# Patient Record
Sex: Male | Born: 1980 | Race: Black or African American | Hispanic: No | Marital: Single
Health system: Midwestern US, Community
[De-identification: ages and names within clinical notes are randomized; demographics above are authoritative.]

## PROBLEM LIST (undated history)

## (undated) DIAGNOSIS — M109 Gout, unspecified: Secondary | ICD-10-CM

## (undated) DIAGNOSIS — E7800 Pure hypercholesterolemia, unspecified: Secondary | ICD-10-CM

## (undated) DIAGNOSIS — I10 Essential (primary) hypertension: Secondary | ICD-10-CM

## (undated) DIAGNOSIS — R7303 Prediabetes: Secondary | ICD-10-CM

## (undated) DIAGNOSIS — Z794 Long term (current) use of insulin: Secondary | ICD-10-CM

## (undated) DIAGNOSIS — E1142 Type 2 diabetes mellitus with diabetic polyneuropathy: Secondary | ICD-10-CM

## (undated) DIAGNOSIS — R142 Eructation: Principal | ICD-10-CM

## (undated) DIAGNOSIS — M1A9XX0 Chronic gout, unspecified, without tophus (tophi): Secondary | ICD-10-CM

## (undated) DIAGNOSIS — E1122 Type 2 diabetes mellitus with diabetic chronic kidney disease: Secondary | ICD-10-CM

## (undated) DIAGNOSIS — N1831 Chronic kidney disease, stage 3a (HCC): Principal | ICD-10-CM

## (undated) DIAGNOSIS — E119 Type 2 diabetes mellitus without complications: Principal | ICD-10-CM

## (undated) DIAGNOSIS — N1832 Chronic kidney disease, stage 3b (HCC): Principal | ICD-10-CM

## (undated) DIAGNOSIS — N184 Chronic kidney disease, stage 4 (severe): Secondary | ICD-10-CM

## (undated) DIAGNOSIS — Z0184 Encounter for antibody response examination: Secondary | ICD-10-CM

---

## 2002-08-04 ENCOUNTER — Encounter: Payer: Self-pay | Admitting: Emergency Medicine

## 2002-08-04 ENCOUNTER — Emergency Department (HOSPITAL_COMMUNITY): Admission: EM | Admit: 2002-08-04 | Discharge: 2002-08-04 | Payer: Self-pay | Admitting: Emergency Medicine

## 2002-10-01 ENCOUNTER — Encounter: Payer: Self-pay | Admitting: Emergency Medicine

## 2002-10-01 ENCOUNTER — Emergency Department (HOSPITAL_COMMUNITY): Admission: EM | Admit: 2002-10-01 | Discharge: 2002-10-02 | Payer: Self-pay | Admitting: Emergency Medicine

## 2002-10-09 ENCOUNTER — Emergency Department (HOSPITAL_COMMUNITY): Admission: EM | Admit: 2002-10-09 | Discharge: 2002-10-09 | Payer: Self-pay | Admitting: Emergency Medicine

## 2002-10-09 ENCOUNTER — Encounter: Payer: Self-pay | Admitting: Emergency Medicine

## 2004-02-12 ENCOUNTER — Emergency Department (HOSPITAL_COMMUNITY): Admission: EM | Admit: 2004-02-12 | Discharge: 2004-02-12 | Payer: Self-pay | Admitting: Emergency Medicine

## 2008-02-25 ENCOUNTER — Emergency Department (HOSPITAL_COMMUNITY): Admission: EM | Admit: 2008-02-25 | Discharge: 2008-02-26 | Payer: Self-pay | Admitting: Emergency Medicine

## 2008-10-14 ENCOUNTER — Encounter: Payer: Self-pay | Admitting: Emergency Medicine

## 2008-10-14 ENCOUNTER — Inpatient Hospital Stay (HOSPITAL_COMMUNITY): Admission: EM | Admit: 2008-10-14 | Discharge: 2008-10-20 | Payer: Self-pay | Admitting: *Deleted

## 2008-10-14 ENCOUNTER — Ambulatory Visit: Payer: Self-pay | Admitting: Internal Medicine

## 2008-10-18 ENCOUNTER — Encounter (INDEPENDENT_AMBULATORY_CARE_PROVIDER_SITE_OTHER): Payer: Self-pay | Admitting: Internal Medicine

## 2008-11-17 ENCOUNTER — Emergency Department (HOSPITAL_COMMUNITY): Admission: EM | Admit: 2008-11-17 | Discharge: 2008-11-17 | Payer: Self-pay | Admitting: Emergency Medicine

## 2011-04-15 NOTE — H&P (Signed)
Michael Hatfield, Michael Hatfield         ACCOUNT NO.:  [id number]   MEDICAL RECORD NO.:  [id number]          PATIENT TYPE:  INP   LOCATION:  1222                         FACILITY:  [facility]   PHYSICIAN:  Della Goo, M.D. DATE OF BIRTH:  1980-12-27   DATE OF ADMISSION:  10/14/2008  DATE OF DISCHARGE:                              HISTORY & PHYSICAL   PRIMARY CARE PHYSICIAN:  None.   CHIEF COMPLAINT:  Severe headache.   HISTORY OF PRESENT ILLNESS:  This is a 30 year old male who presents to  the emergency department with complaints of a severe headache over a 1-  week period which has been worsening over the past 24 hours.  The  patient reports that the headache began to worsen, 10/10 in severity and  was increased across his forehead and over the right eye area.  The  patient reports having visual changes associated with the headache.  He  denies having any syncope, but does report being lightheaded with the  symptoms.  Denies having any nausea or vomiting.  The patient was  evaluated at the [facility] emergency department initially.  He was found to have a blood pressure of 224/143.  The patient reported  being out of his medications for 1 month and being unable to get his  medication secondary to the cost.  The patient is visiting from out-of-  town, he is from [place].   PAST MEDICAL HISTORY:  Significant for hypertension.   PAST SURGICAL HISTORY:  None.   MEDICATIONS:  1. Norvasc 10 mg p.o. daily.  2. Metoprolol 100 mg p.o. b.i.d.  3. Hydrochlorothiazide 25 mg 1 p.o. q.a.m.   ALLERGIES:  NO KNOWN DRUG ALLERGIES.   SOCIAL HISTORY:  The patient is a smoker.  He reports smoking a fourth  of a pack daily for 13 years.  He denies alcohol usage.  He reports  occasional marijuana usage.   FAMILY HISTORY:  Positive for hypertension.   REVIEW OF SYSTEMS:  Pertinents are mentioned above.   PHYSICAL EXAMINATION:  GENERAL:  This is a 30 year old morbidly obese  male in  discomfort but no acute distress.  VITAL SIGNS:  Temperature 98.1, blood pressure 224/143 initially, heart  rate 97, respirations 22.  O2 sats 98% on room air.  HEENT:  Normocephalic, atraumatic.  Pupils are reactive to light and  symmetric.  Extraocular movements are intact.  Funduscopic benign.  Oropharynx is clear.  NECK:  Supple.  Full range of motion.  No thyromegaly, adenopathy or  jugulovenous distention.  CARDIOVASCULAR:  Regular rate and rhythm.  No  murmurs, gallops or rubs.  LUNGS:  Clear to auscultation bilaterally.  ABDOMEN:  Positive bowel sounds.  Soft, nontender, nondistended.  EXTREMITIES:  Without cyanosis, clubbing or edema.  NEUROLOGIC:  Nonfocal.   LABORATORY STUDIES:  Chemistry reveals a sodium of 138, potassium 3.8,  chloride 100, bicarb 29, BUN 15, creatinine 1.3 and glucose 110.  Cardiac enzymes point of care markers:  Myoglobin level 148, CK-MB 1.2,  troponin less than 0.05.   ASSESSMENT:  A 30 year old male being admitted with:  1. Hypertensive urgency.  2. Morbid obesity.  3.  Tobacco abuse.  4. Cephalgia secondary to #1.   PLAN:  The patient will be admitted to an ICU area.  He arrived on a  labetalol drip.  However, his blood pressure is not improving with  titration of the labetalol.  A Cardene drip has been ordered and will be  started to help titrate his blood pressure downward.  His regular  medications will be ordered and resumed in the a.m.  The patient will be  placed on a nicotine patch to prevent nicotine withdrawal and he will  also be placed on DVT and GI prophylaxis.  Further workup will ensue  pending results of his laboratory studies and his clinical course.      Della Goo, M.D.  Electronically Signed     HJ/MEDQ  D:  10/14/2008  T:  10/14/2008  Job:  [id number]

## 2011-04-15 NOTE — Discharge Summary (Signed)
Michael Hatfield, Michael Hatfield         ACCOUNT NO.:  [id number]   MEDICAL RECORD NO.:  [id number]          PATIENT TYPE:  INP   LOCATION:  1401                         FAC[facility]  [facility]   PHYSICIAN:  Herbie Saxon, MDDATE OF BIRTH:  12/12/1980   DATE OF ADMISSION:  10/14/2008  DATE OF DISCHARGE:  10/20/2008                               DISCHARGE SUMMARY   DISCHARGE DIAGNOSES:  1. Severe hypertension controlled.  2. Morbid obesity.  3. New onset diabetes.  4. Tobacco abuse.  5. Hyperlipidemia.  6. Hypokalemia repleted.  7. Leukocytosis improved.  8. Episodic bradycardia asymptomatic.  9. Poor medical compliance.  10.History of cannabis abuse.   RADIOLOGY:  The renal ultrasound of October 19, 2008, shows normal  kidneys.  CT head at admission showed no acute intracranial abnormality.   PAST MEDICAL HISTORY:  This 30 year old African American male presented  to the emergency room with complaint of severe headache with blurring of  vision.  On presentation his blood pressure was severely elevated at  224/143.  He had not been compliant with his blood pressure medication  for 1 month .  He was admitted to the intensive care unit, placed on a  Cardene drip which was titrated downward and weaned.  Counseled on  tobacco and cannabis abuse cessation.  Started on nicotine patch and  sedatives as needed.  History of hypokalemia which was repleted.  Magnesium level was found to be okay.  His blood pressure regimen  included labetalol, hydralazine, diltiazem and lisinopril to obtain a  good control.  Hemoglobin A1c checked and returned 6.6. He was started  on low dose metformin 500 mg daily with sliding scale insulin coverage .  Hyperglycemia improved.  Lipid panel showed that HDL level was 69, LDL  was 132.  He has been started on Niacin.  Homocysteine level was normal.  Unable to do a renal angiography at present, this could be arranged as  an outpatient to rule out renal artery  stenosis.   DISCHARGE CONDITION:  Is stable.   DIET:  Will be heart healthy, 1800 calorie, ADA low-cholesterol.   ACTIVITY:  Increase daily as tolerated.   FOLLOWUP:  With primary care physician at [facility] in the next 5-  7 days.   DISCHARGE MEDICATIONS:  1. Lisinopril 40 mg daily.  2. Diltiazem CD 180 mg b.i.d.  3. Hydralazine 100 mg t.i.d.  4. HCTZ 25 mg daily.  5. Metoprolol 100 mg b.i.d.  6. Potassium chloride 10 mEq daily.  7. Metformin 500 mg daily.  Accu-Chek q.a.c. and h.s. and sliding      scale insulin coverage .   PHYSICAL EXAMINATION:  GENERAL:  On examination this is a young man who  is not in acute respiratory distress.  VITAL SIGNS:  His temperature is 98, pulse 75, respiratory rate 20,  blood pressure 134/80.  HEENT:  Pupils are equal and reactive to light and accommodation.  Head  is atraumatic, normocephalic.  Mucous membranes are moist.  Oropharynx  and nasopharynx are clear.  NECK:  Supple.  No elevated JVD or carotid bruits.  No thyromegaly.  CHEST:  Clinically clear.  HEART:  Sounds 1 and 2 regular rate and rhythm.  ABDOMEN:  Benign. inguinal orifices are patent.  Bowel sounds present.  NEUROLOGICAL:  No neurologic deficit.  He is alert and oriented to time,  place and person.  Deep tendon reflexes are 2 globally.  Peripheral  pulses present .  There is no pedal edema.   Available labs show a chemistry on October 20, 2008, sodium is 140,  potassium 3.9, chloride 102, bicarbonate 29, glucose 106, BUN 10,  creatinine 1.5.  WBC is 13, hematocrit 44, platelet count is 203.  He is  to have his CBC repeated as an outpatient with primary care physician.  AST 20, ALT 24 and direct bilirubin 1.1.  Albumin is 3.3.      Herbie Saxon, MD  Electronically Signed     MIO/MEDQ  D:  10/20/2008  T:  10/20/2008  Job:  [id number]

## 2011-08-25 LAB — BASIC METABOLIC PANEL
CO2: 28
Chloride: 101
GFR calc Af Amer: >60
Potassium: 3.2 — ABNORMAL LOW
Sodium: 137

## 2011-08-25 LAB — DIFFERENTIAL
Basophils Relative: 0
Eosinophils Absolute: 0.3
Lymphs Abs: 1.8
Monocytes Absolute: 0.5
Monocytes Relative: 4
Neutrophils Relative %: 79 — ABNORMAL HIGH

## 2011-08-25 LAB — CBC
Hemoglobin: 14.6
MCHC: 34.2
MCV: 82.4
RBC: 5.18
WBC: 12.8 — ABNORMAL HIGH

## 2011-09-02 LAB — COMPREHENSIVE METABOLIC PANEL
Albumin: 3.3 — ABNORMAL LOW
Alkaline Phosphatase: 69
BUN: 10
CO2: 29
Chloride: 103
Creatinine, Ser: 1.5
GFR calc non Af Amer: 56 — ABNORMAL LOW
Glucose, Bld: 106 — ABNORMAL HIGH
Potassium: 3.9
Total Bilirubin: 1.1

## 2011-09-02 LAB — TSH: TSH: 3.746

## 2011-09-02 LAB — CK TOTAL AND CKMB (NOT AT ARMC)
CK, MB: 1
Relative Index: 0.7

## 2011-09-02 LAB — CBC
HCT: 44.2
HCT: 44.8
Hemoglobin: 14.8
Hemoglobin: 14.8
MCHC: 33.4
MCV: 82.8
MCV: 82.8
RBC: 5.34
RBC: 5.47
RBC: 5.52
RDW: 13.3
WBC: 12.5 — ABNORMAL HIGH
WBC: 13.4 — ABNORMAL HIGH

## 2011-09-02 LAB — BASIC METABOLIC PANEL
BUN: 13
BUN: 9
CO2: 25
CO2: 29
Calcium: 9.5
Chloride: 100
Chloride: 101
Chloride: 101
Chloride: 103
Chloride: 99
Creatinine, Ser: 1.08
Creatinine, Ser: 1.22
GFR calc Af Amer: >60
GFR calc Af Amer: >60
GFR calc Af Amer: >60
GFR calc non Af Amer: 57 — ABNORMAL LOW
GFR calc non Af Amer: >60
Glucose, Bld: 128 — ABNORMAL HIGH
Potassium: 3.2 — ABNORMAL LOW
Potassium: 3.5
Potassium: 3.6
Sodium: 134 — ABNORMAL LOW
Sodium: 136
Sodium: 139

## 2011-09-02 LAB — MAGNESIUM: Magnesium: 1.9

## 2011-09-02 LAB — GLUCOSE, CAPILLARY
Glucose-Capillary: 123 — ABNORMAL HIGH
Glucose-Capillary: 142 — ABNORMAL HIGH
Glucose-Capillary: 160 — ABNORMAL HIGH

## 2011-09-02 LAB — HEPATIC FUNCTION PANEL
ALT: 24
AST: 20
Albumin: 3.3 — ABNORMAL LOW
Alkaline Phosphatase: 67
Bilirubin, Direct: 0.1
Total Bilirubin: 1.2

## 2011-09-02 LAB — CARDIAC PANEL(CRET KIN+CKTOT+MB+TROPI)
Relative Index: 0.9
Troponin I: 0.02
Troponin I: 0.02

## 2011-09-02 LAB — LIPID PANEL
Cholesterol: 195
LDL Cholesterol: 132 — ABNORMAL HIGH
Total CHOL/HDL Ratio: 12.2
VLDL: 47 — ABNORMAL HIGH

## 2011-09-02 LAB — METANEPHRINES, PLASMA
Metanephrine, Free: <25 pg/mL (ref <=57)
Normetanephrine, Free: 180 pg/mL — ABNORMAL HIGH (ref <=148)
Total Metanephrines-Plasma: 180 pg/mL (ref <=205)

## 2011-09-02 LAB — HEMOGLOBIN A1C: Mean Plasma Glucose: 143

## 2011-09-03 LAB — BASIC METABOLIC PANEL
BUN: 15
Calcium: 9.6
Chloride: 100
Creatinine, Ser: 1.3
GFR calc Af Amer: >60

## 2011-09-03 LAB — POCT CARDIAC MARKERS
CKMB, poc: 1.2
Troponin i, poc: <0.05

## 2016-08-08 ENCOUNTER — Emergency Department (HOSPITAL_BASED_OUTPATIENT_CLINIC_OR_DEPARTMENT_OTHER)
Admission: EM | Admit: 2016-08-08 | Discharge: 2016-08-08 | Disposition: A | Discharge status: Home / Self Care | Payer: Managed Care, Other (non HMO) | Attending: Emergency Medicine | Admitting: Emergency Medicine

## 2016-08-08 ENCOUNTER — Encounter (HOSPITAL_BASED_OUTPATIENT_CLINIC_OR_DEPARTMENT_OTHER): Payer: Self-pay | Admitting: Emergency Medicine

## 2016-08-08 DIAGNOSIS — Z79899 Other long term (current) drug therapy: Secondary | ICD-10-CM | POA: Insufficient documentation

## 2016-08-08 DIAGNOSIS — M79672 Pain in left foot: Secondary | ICD-10-CM | POA: Diagnosis not present

## 2016-08-08 DIAGNOSIS — I10 Essential (primary) hypertension: Secondary | ICD-10-CM | POA: Diagnosis not present

## 2016-08-08 DIAGNOSIS — R51 Headache: Secondary | ICD-10-CM | POA: Insufficient documentation

## 2016-08-08 HISTORY — DX: Gout, unspecified: M10.9

## 2016-08-08 HISTORY — DX: Essential (primary) hypertension: I10

## 2016-08-08 MED ORDER — AMLODIPINE BESYLATE 10 MG PO TABS: 10.0000 mg | ORAL_TABLET | Freq: Every day | ORAL | 0 refills | Status: DC

## 2016-08-08 MED ORDER — LABETALOL HCL 100 MG PO TABS: 200.0000 mg | ORAL_TABLET | Freq: Two times a day (BID) | ORAL | 0 refills | Status: DC

## 2016-08-08 MED ORDER — HYDROCHLOROTHIAZIDE 25 MG PO TABS: 25.0000 mg | ORAL_TABLET | Freq: Every day | ORAL | 0 refills | Status: DC

## 2016-08-08 MED ORDER — INDOMETHACIN 50 MG PO CAPS: 50.0000 mg | ORAL_CAPSULE | Freq: Two times a day (BID) | ORAL | 0 refills | Status: AC

## 2016-08-08 MED FILL — LABETALOL HCL 100 MG TABLET: 100 | 7 days supply | Qty: 28 | Fill #0

## 2016-08-08 MED FILL — HYDROCHLOROTHIAZIDE 25 MG T: 25 | 7 days supply | Qty: 7 | Fill #0

## 2016-08-08 MED FILL — AMLODIPINE BESYLATE 10 MG T: 10 | 7 days supply | Qty: 7 | Fill #0

## 2016-08-08 MED FILL — INDOMETHACIN 50 MG CAPSULE: 50 | 7 days supply | Qty: 14 | Fill #0

## 2016-08-08 NOTE — ED Provider Notes (Signed)
[facility] Provider Note   CSN: [id number] Arrival date & time: 08/08/16  1609  By signing my name below, I, Phillis Haggis, attest that this documentation has been prepared under the direction and in the presence of Felicie Morn, NP-C. Electronically Signed: Phillis Haggis, ED Scribe. 08/08/16. 4:57 PM.   History   Chief Complaint Chief Complaint  Patient presents with  . Foot Pain    The history is provided by the patient. No language interpreter was used.  Foot Pain  This is a new problem. The current episode started more than 2 days ago. The problem occurs constantly. The problem has been gradually worsening. Associated symptoms include headaches. He has tried nothing for the symptoms.  HPI Comments: Michael Hatfield is a 35 y.o. Male with a hx of gout, HTN, and borderline Diabetes who presents to the Emergency Department complaining of right foot swelling and pain onset 3 days ago. His worst pain is over the dorsum of the foot. Pt reports that this pain feels similar to his past hx of gout. Pt is also complaining of increased BP. He states that he is from [place] and typically takes 3 medications including HCTZ, labetalol and amlodipine. He has since been out during his visit to [facility]. Pt reports headache secondary to increased BP. Pt has previously taken ibuprofen and drank cherry juice for gout flare ups. He denies visual changes, numbness or weakness.   Past Medical History:  Diagnosis Date  . Gout   . Hypertension     There are no active problems to display for this patient.   History reviewed. No pertinent surgical history.     Home Medications    Prior to Admission medications   Medication Sig Start Date End Date Taking? Authorizing Provider  amLODipine-benazepril (LOTREL) 5-10 MG capsule Take 1 capsule by mouth daily.   Yes Historical Provider, MD  hydrochlorothiazide (HYDRODIURIL) 25 MG tablet Take 25 mg by mouth daily.   Yes Historical Provider, MD    labetalol (NORMODYNE) 200 MG tablet Take 200 mg by mouth 2 (two) times daily.   Yes Historical Provider, MD    Family History History reviewed. No pertinent family history.  Social History Social History  Substance Use Topics  . Smoking status: Never Smoker  . Smokeless tobacco: Never Used  . Alcohol use No     Allergies   Review of patient's allergies indicates no known allergies.   Review of Systems Review of Systems  Eyes: Negative for visual disturbance.  Musculoskeletal: Positive for arthralgias and joint swelling.  Neurological: Positive for headaches. Negative for weakness and numbness.  All other systems reviewed and are negative.  Physical Exam Updated Vital Signs BP (!) 214/135 Comment: hasn't taken BP meds in several days, visiting from [place]  Pulse 97   Temp 98.2 F (36.8 C) (Oral)   Resp 18   Ht 6\' 2"  (1.88 m)   Wt (!) 370 lb (167.8 kg)   SpO2 98%   BMI 47.51 kg/m   Physical Exam  Constitutional: He is oriented to person, place, and time. He appears well-developed and well-nourished.  HENT:  Head: Normocephalic and atraumatic.  Eyes: Conjunctivae and EOM are normal. Pupils are equal, round, and reactive to light.  Neck: Normal range of motion. Neck supple.  Cardiovascular: Normal rate and regular rhythm.   Pulmonary/Chest: Effort normal.  Musculoskeletal: Normal range of motion.  Moderately warm dorsum to the right foot with mild pain to the outer aspect of the metatarsals;  good distal pulses and sensation  Neurological: He is alert and oriented to person, place, and time.  Skin: Skin is warm and dry.  Psychiatric: He has a normal mood and affect. His behavior is normal.  Nursing note and vitals reviewed.    ED Treatments / Results  DIAGNOSTIC STUDIES: Oxygen Saturation is 98% on RA, normal by my interpretation.    COORDINATION OF CARE: 4:53 PM-Discussed treatment plan which includes anti-inflammatories and crutches with pt at bedside and pt  agreed to plan.    Labs (all labs ordered are listed, but only abnormal results are displayed) Labs Reviewed - No data to display  EKG  EKG Interpretation None       Radiology No results found.  Procedures Procedures (including critical care time)  Medications Ordered in ED Medications - No data to display   Initial Impression / Assessment and Plan / ED Course  I have reviewed the triage vital signs and the nursing notes.  Pertinent labs & imaging results that were available during my care of the patient were reviewed by me and considered in my medical decision making (see chart for details).  Clinical Course  Patient with history of gout. Left foot pain with mild swelling and warmth. Does not appear to be cellulitis. Conservative therapy recommended and discussed. Patient will be discharged home with indocin & is agreeable with above plan. Patient also provided with Rx for limited anti-hypertensive medications. He is scheduled to return to his home in [place] in the next two days. He will follow-up with his PCP upon his return.  Return precautions discussed. Pt appears safe for discharge.    Final Clinical Impressions(s) / ED Diagnoses   Final diagnoses:  None  I personally performed the services described in this documentation, which was scribed in my presence. The recorded information has been reviewed and is accurate.   New Prescriptions New Prescriptions   No medications on file     Felicie Mornavid Ezell Poke, NP 08/09/16 0121    Rolan BuccoMelanie Belfi, MD 08/10/16 [phone]

## 2016-08-08 NOTE — ED Triage Notes (Signed)
Pt states right foot pain, thinks it's his gout

## 2019-05-08 ENCOUNTER — Encounter (HOSPITAL_BASED_OUTPATIENT_CLINIC_OR_DEPARTMENT_OTHER): Payer: Self-pay | Admitting: Emergency Medicine

## 2019-05-08 ENCOUNTER — Emergency Department (HOSPITAL_BASED_OUTPATIENT_CLINIC_OR_DEPARTMENT_OTHER): Payer: Self-pay

## 2019-05-08 ENCOUNTER — Other Ambulatory Visit: Payer: Self-pay

## 2019-05-08 ENCOUNTER — Emergency Department (HOSPITAL_BASED_OUTPATIENT_CLINIC_OR_DEPARTMENT_OTHER)
Admission: EM | Admit: 2019-05-08 | Discharge: 2019-05-08 | Disposition: A | Discharge status: Home / Self Care | Payer: Self-pay | Attending: Emergency Medicine | Admitting: Emergency Medicine

## 2019-05-08 DIAGNOSIS — I129 Hypertensive chronic kidney disease with stage 1 through stage 4 chronic kidney disease, or unspecified chronic kidney disease: Secondary | ICD-10-CM | POA: Insufficient documentation

## 2019-05-08 DIAGNOSIS — N183 Chronic kidney disease, stage 3 unspecified: Secondary | ICD-10-CM

## 2019-05-08 DIAGNOSIS — M109 Gout, unspecified: Secondary | ICD-10-CM | POA: Insufficient documentation

## 2019-05-08 DIAGNOSIS — M25572 Pain in left ankle and joints of left foot: Secondary | ICD-10-CM

## 2019-05-08 DIAGNOSIS — I10 Essential (primary) hypertension: Secondary | ICD-10-CM

## 2019-05-08 DIAGNOSIS — Z79899 Other long term (current) drug therapy: Secondary | ICD-10-CM | POA: Insufficient documentation

## 2019-05-08 LAB — BASIC METABOLIC PANEL
Anion gap: 8 (ref 5–15)
BUN: 22 mg/dL — ABNORMAL HIGH (ref 6–20)
CO2: 27 mmol/L (ref 22–32)
Calcium: 9.2 mg/dL (ref 8.9–10.3)
Chloride: 100 mmol/L (ref 98–111)
Creatinine, Ser: 1.94 mg/dL — ABNORMAL HIGH (ref 0.61–1.24)
GFR calc Af Amer: 49 mL/min — ABNORMAL LOW (ref >60)
GFR calc non Af Amer: 43 mL/min — ABNORMAL LOW (ref >60)
Glucose, Bld: 184 mg/dL — ABNORMAL HIGH (ref 70–99)
Potassium: 3.7 mmol/L (ref 3.5–5.1)
Sodium: 135 mmol/L (ref 135–145)

## 2019-05-08 LAB — CBC
HCT: 44.9 % (ref 39.0–52.0)
Hemoglobin: 14.2 g/dL (ref 13.0–17.0)
MCH: 27.2 pg (ref 26.0–34.0)
MCHC: 31.6 g/dL (ref 30.0–36.0)
MCV: 86 fL (ref 80.0–100.0)
Platelets: 183 10*3/uL (ref 150–400)
RBC: 5.22 MIL/uL (ref 4.22–5.81)
RDW: 13.5 % (ref 11.5–15.5)
WBC: 10.4 10*3/uL (ref 4.0–10.5)
nRBC: 0 % (ref 0.0–0.2)

## 2019-05-08 MED ORDER — TRAMADOL HCL 50 MG PO TABS
50.0000 mg | ORAL_TABLET | Freq: Once | ORAL | Status: AC
  Administered 2019-05-08: 50 mg via ORAL
  Filled 2019-05-08: qty 1

## 2019-05-08 MED ORDER — HYDROCHLOROTHIAZIDE 25 MG PO TABS: 25.0000 mg | ORAL_TABLET | Freq: Every day | ORAL | 1 refills | Status: DC

## 2019-05-08 MED ORDER — ACETAMINOPHEN 500 MG PO TABS
1000.0000 mg | ORAL_TABLET | Freq: Once | ORAL | Status: AC
  Administered 2019-05-08: 1000 mg via ORAL
  Filled 2019-05-08: qty 2

## 2019-05-08 MED ORDER — HYDROCHLOROTHIAZIDE 25 MG PO TABS: 25.0000 mg | ORAL_TABLET | Freq: Every day | ORAL | 1 refills | Status: AC

## 2019-05-08 MED ORDER — METFORMIN HCL 500 MG PO TABS: 500.0000 mg | ORAL_TABLET | Freq: Two times a day (BID) | ORAL | 1 refills | Status: AC

## 2019-05-08 MED ORDER — TRAMADOL HCL 50 MG PO TABS: 50.0000 mg | ORAL_TABLET | Freq: Four times a day (QID) | ORAL | 0 refills | Status: AC | PRN

## 2019-05-08 MED ORDER — PREDNISONE 10 MG PO TABS: 40.0000 mg | ORAL_TABLET | Freq: Every day | ORAL | 0 refills | Status: AC

## 2019-05-08 MED ORDER — LISINOPRIL 20 MG PO TABS: 20.0000 mg | ORAL_TABLET | Freq: Every day | ORAL | 1 refills | Status: AC

## 2019-05-08 MED ORDER — SIMVASTATIN 10 MG PO TABS: 10.0000 mg | ORAL_TABLET | Freq: Every day | ORAL | 1 refills | Status: AC

## 2019-05-08 MED ORDER — AMLODIPINE BESYLATE 5 MG PO TABS
10.0000 mg | ORAL_TABLET | Freq: Once | ORAL | Status: AC
  Administered 2019-05-08: 10 mg via ORAL
  Filled 2019-05-08: qty 2

## 2019-05-08 MED ORDER — CARVEDILOL 12.5 MG PO TABS: 12.5000 mg | ORAL_TABLET | Freq: Two times a day (BID) | ORAL | 1 refills | Status: AC

## 2019-05-08 NOTE — ED Triage Notes (Signed)
Pt c/o left foot swelling and pain x 2 days. Pt states he is non compliant on bp medication r/t layoff from job. Denies injury

## 2019-05-08 NOTE — Progress Notes (Signed)
Michael Hatfield ED TOC CM -referral PCP/medication assistance  Spoke to pt and states he lost his job and insurance. His meds are over $ 100 at the pharmacy and he has been attempting to pay out of pocket. Explained [facility] and once per year use with a $3 copay for meds excludes narcotics. Will call [facility] to arrange a follow up PCP appt and will call pt tomorrow with info. Verified his correct number. Jonnie Finner RN CCM Case Mgmt phone [phone]

## 2019-05-08 NOTE — ED Provider Notes (Signed)
[facility] EMERGENCY DEPARTMENT Provider Note   CSN: [id number] Arrival date & time: 05/08/19  1145    History   Chief Complaint Chief Complaint  Patient presents with  . Foot Pain    HPI Michael Hatfield is a 38 y.o. male.     HPI   38 year old male with a history of gout and hypertension presents with concern for left ankle pain and swelling.  He was laid off from his job, currently does not have insurance, and ran out of a few of his blood pressure medications.  Denies any long trips car or airplane, recent surgeries or recent immobilization.  No history of DVT or PE or family history of DVT or PE.  Denies any calf pain or tenderness.  Reports that the pain is localized to his ankle joint.  Feels different than the gout he has had in the past given the location, and pain is not quite as severe. Reports pain not helped by tylenol, worse with movements, worse with ambulation.  Denies fevers, redness, numbness or other concerns.  No history of injury.  Has high blood pressure, but denies chest pain, shortness of breath, numbness, weakness, difficulty speaking, change in vision.  Past Medical History:  Diagnosis Date  . Gout   . Hypertension     There are no active problems to display for this patient.   History reviewed. No pertinent surgical history.      Home Medications    Prior to Admission medications   Medication Sig Start Date End Date Taking? Authorizing Provider  carvedilol (COREG) 12.5 MG tablet Take 1 tablet (12.5 mg total) by mouth 2 (two) times daily with a meal for 30 days. 05/08/19 06/07/19  Alvira MondaySchlossman, Colleen Donahoe, MD  hydrochlorothiazide (HYDRODIURIL) 25 MG tablet Take 1 tablet (25 mg total) by mouth daily for 30 days. 05/08/19 06/07/19  Alvira MondaySchlossman, Sybel Standish, MD  indomethacin (INDOCIN) 50 MG capsule Take 1 capsule (50 mg total) by mouth 2 (two) times daily with a meal. 08/08/16   Felicie MornSmith, David, NP  lisinopril (ZESTRIL) 20 MG tablet Take 1 tablet (20 mg total) by  mouth daily for 30 days. 05/08/19 06/07/19  Alvira MondaySchlossman, Missey Hasley, MD  metFORMIN (GLUCOPHAGE) 500 MG tablet Take 1 tablet (500 mg total) by mouth 2 (two) times daily with a meal for 30 days. 05/08/19 06/07/19  Alvira MondaySchlossman, Canary Fister, MD  predniSONE (DELTASONE) 10 MG tablet Take 4 tablets (40 mg total) by mouth daily for 4 days. 05/08/19 05/12/19  Alvira MondaySchlossman, Azaria Bartell, MD  simvastatin (ZOCOR) 10 MG tablet Take 1 tablet (10 mg total) by mouth daily for 30 days. 05/08/19 06/07/19  Alvira MondaySchlossman, Ladean Steinmeyer, MD  traMADol (ULTRAM) 50 MG tablet Take 1 tablet (50 mg total) by mouth every 6 (six) hours as needed. 05/08/19   Alvira MondaySchlossman, Shantoria Ellwood, MD    Family History History reviewed. No pertinent family history.  Social History Social History   Tobacco Use  . Smoking status: Never Smoker  . Smokeless tobacco: Never Used  Substance Use Topics  . Alcohol use: No  . Drug use: Yes    Types: Marijuana     Allergies   Patient has no known allergies.   Review of Systems Review of Systems  Constitutional: Negative for fever.  HENT: Negative for sore throat.   Eyes: Negative for visual disturbance.  Respiratory: Negative for shortness of breath.   Cardiovascular: Negative for chest pain.  Gastrointestinal: Negative for abdominal pain, nausea and vomiting.  Genitourinary: Negative for difficulty urinating.  Musculoskeletal: Positive for  arthralgias. Negative for back pain and neck stiffness.  Skin: Negative for rash.  Neurological: Negative for syncope and headaches.     Physical Exam Updated Vital Signs BP (!) 181/119   Pulse 79   Temp 98.4 F (36.9 C) (Oral)   Resp 18   Ht 6\' 2"  (1.88 m)   Wt (!) 172.4 kg   SpO2 99%   BMI 48.79 kg/m   Physical Exam Vitals signs and nursing note reviewed.  Constitutional:      General: He is not in acute distress.    Appearance: He is well-developed. He is not diaphoretic.  HENT:     Head: Normocephalic and atraumatic.  Eyes:     Conjunctiva/sclera: Conjunctivae normal.  Neck:      Musculoskeletal: Normal range of motion.  Cardiovascular:     Rate and Rhythm: Normal rate and regular rhythm.     Heart sounds: Normal heart sounds. No murmur. No friction rub. No gallop.   Pulmonary:     Effort: Pulmonary effort is normal. No respiratory distress.     Breath sounds: Normal breath sounds. No wheezing or rales.  Abdominal:     General: There is no distension.     Palpations: Abdomen is soft.     Tenderness: There is no abdominal tenderness. There is no guarding.  Musculoskeletal:     Left ankle: He exhibits swelling. He exhibits no deformity, no laceration and normal pulse. Decreased range of motion: pain with movement but good ROM. Tenderness. Lateral malleolus, medial malleolus and AITFL tenderness found.     Left lower leg: He exhibits no bony tenderness and no swelling.  Skin:    General: Skin is warm and dry.  Neurological:     Mental Status: He is alert and oriented to person, place, and time.      ED Treatments / Results  Labs (all labs ordered are listed, but only abnormal results are displayed) Labs Reviewed  BASIC METABOLIC PANEL - Abnormal; Notable for the following components:      Result Value   Glucose, Bld 184 (*)    BUN 22 (*)    Creatinine, Ser 1.94 (*)    GFR calc non Af Amer 43 (*)    GFR calc Af Amer 49 (*)    All other components within normal limits  CBC    EKG None  Radiology Dg Ankle Complete Left  Result Date: 05/08/2019 CLINICAL DATA:  Left ankle pain a few days.  No injury. EXAM: LEFT ANKLE COMPLETE - 3+ VIEW COMPARISON:  None. FINDINGS: Diffuse soft tissue swelling over the left ankle. Ankle mortise is normal. No evidence of acute fracture or dislocation. Remainder of the exam is unremarkable. IMPRESSION: Diffuse soft tissue swelling.  No bony abnormality. Electronically Signed   By: Elberta Fortisaniel  Boyle M.D.   On: 05/08/2019 13:49    Procedures Procedures (including critical care time)  Medications Ordered in ED Medications   amLODipine (NORVASC) tablet 10 mg (10 mg Oral Given 05/08/19 1322)  acetaminophen (TYLENOL) tablet 1,000 mg (1,000 mg Oral Given 05/08/19 1509)  traMADol (ULTRAM) tablet 50 mg (50 mg Oral Given 05/08/19 1509)     Initial Impression / Assessment and Plan / ED Course  I have reviewed the triage vital signs and the nursing notes.  Pertinent labs & imaging results that were available during my care of the patient were reviewed by me and considered in my medical decision making (see chart for details).  38 year old male with a history of gout and hypertension presents with concern for left ankle pain and swelling.  Strong pulses bilaterally, no sign of acute arterial thrombus.  Does not have swelling to the lower leg or calf, no pain radiating upwards, pain is located to the ankle, and I have a low suspicion for DVT.  He denies DVT risk factors on my history.  No erythema, good range of motion, no fever, low suspicion for septic arthritis.  He has been out of a few of his blood pressure medications in the setting of losing his job and insurance.  He denies symptoms, has no sign of blood pressure emergency.  Given he has not had any recent lab work done, creatinine was checked which is 1.94, and suspect patient does have chronic kidney disease.  Discussed both importance of blood pressure control, and avoidance of other renal insults, including avoiding NSAIDs.  Provided patient with prescription for his regular medications, and case management was consulted.  Case management will set up a PCP appointment for him and he will have outpatient lab monitoring. I suspect his left ankle pain was likely secondary to gout.  Reviewed in [place] drug database, with no prior narcotic prescriptions, and discussed risks of tramadol.  Given he is unable to take NSAIDs due to his renal disease, I have written a prescription for tramadol and recommended full dose Tylenol as well.  Also gave prescription for prednisone for  4 days, with discussion of risks of hyperglycemia. Patient discharged in stable condition with understanding of reasons to return.   Final Clinical Impressions(s) / ED Diagnoses   Final diagnoses:  Acute left ankle pain  Acute gout of left ankle, unspecified cause  Essential hypertension  Chronic kidney disease, stage 3, mod decreased GFR (HCC)    ED Discharge Orders         Ordered    metFORMIN (GLUCOPHAGE) 500 MG tablet  2 times daily with meals     05/08/19 1437    lisinopril (ZESTRIL) 20 MG tablet  Daily     05/08/19 1437    carvedilol (COREG) 12.5 MG tablet  2 times daily with meals     05/08/19 1437    hydrochlorothiazide (HYDRODIURIL) 25 MG tablet  Daily,   Status:  Discontinued     05/08/19 1437    simvastatin (ZOCOR) 10 MG tablet  Daily     05/08/19 1437    predniSONE (DELTASONE) 10 MG tablet  Daily     05/08/19 1546    traMADol (ULTRAM) 50 MG tablet  Every 6 hours PRN     05/08/19 1546    hydrochlorothiazide (HYDRODIURIL) 25 MG tablet  Daily     05/08/19 1552           Gareth Morgan, MD 05/09/19 [phone]

## 2019-05-08 NOTE — ED Notes (Signed)
ED Provider at bedside. 

## 2019-05-08 NOTE — ED Notes (Signed)
Pt on phone with case management.

## 2019-05-08 NOTE — TOC Initial Note (Addendum)
Transition of Care [facility]) - Initial/Assessment Note    Patient Details  Name: Michael Hatfield MRN: [id number] Date of Birth: 02/29/1981  Transition of Care [facility]) CM/SW Contact:    Erenest Rasher, RN Phone Number: [phone] 05/08/2019, 4:05 PM  Clinical Narrative:  PCP/Medication assistance  Spoke to pt and states he lost his job and insurance. His meds are over $ 100 at the pharmacy and he has been attempting to pay out of pocket. Explained [facility] and once per year use with a $3 copay for meds excludes narcotics. Arnett letter to [employer] and spoke to pharmacist. Will call [facility] to arrange a follow up PCP appt and will call pt tomorrow with info. Verified his correct number.                Expected Discharge Plan: Home/Self Care Barriers to Discharge: No Barriers Identified   Patient Goals and CMS Choice        Expected Discharge Plan and Services Expected Discharge Plan: Home/Self Care   Discharge Planning Services: CM Consult, [facility], Medication Assistance, [facility]   Living arrangements for the past 2 months: Apartment                                      Prior Living Arrangements/Services Living arrangements for the past 2 months: Apartment Lives with:: Self Patient language and need for interpreter reviewed:: Yes Do you feel safe going back to the place where you live?: Yes      Need for Family Participation in Patient Care: No (Comment) Care giver support system in place?: No (comment)   Criminal Activity/Legal Involvement Pertinent to Current Situation/Hospitalization: No - Comment as needed  Activities of Daily Living      Permission Sought/Granted Permission sought to share information with : Case Manager, PCP, Other (comment) Permission granted to share information with : Yes, Verbal Permission Granted              Emotional Assessment   Attitude/Demeanor/Rapport: Engaged Affect  (typically observed): Accepting Orientation: : Oriented to Place, Oriented to Self, Oriented to  Time, Oriented to Situation   Psych Involvement: No (comment)  Admission diagnosis:  Foot Pain There are no active problems to display for this patient.  PCP:  Patient, No Pcp Per Pharmacy:   [facility] [place], [place] - [address] [place] [address] Phone: [phone] Fax: [phone]     Social Determinants of Health (SDOH) Interventions    Readmission Risk Interventions No flowsheet data found.

## 2019-05-08 NOTE — ED Notes (Signed)
Attempt x 2 for lab collection unsuccessful right hand and right [facility]

## 2019-05-09 ENCOUNTER — Telehealth: Payer: Self-pay | Admitting: *Deleted

## 2019-05-09 NOTE — Telephone Encounter (Signed)
Contacted pt to provide him with information on his follow up appt at [facility] on 05/19/2019 at 9:50 am. Left message for a return call. Jonnie Finner RN CCM Case Mgmt phone [phone]

## 2019-05-18 NOTE — Progress Notes (Deleted)
Patient ID: Michael Hatfield, male   DOB: 1981-02-08, 38 y.o.   MRN: [id number]  After being seen in the ED 05/08/2019.    From ED note: 38 year old male with a history of gout and hypertension presents with concern for left ankle pain and swelling.  He was laid off from his job, currently does not have insurance, and ran out of a few of his blood pressure medications.  Denies any long trips car or airplane, recent surgeries or recent immobilization.  No history of DVT or PE or family history of DVT or PE.  Denies any calf pain or tenderness.  Reports that the pain is localized to his ankle joint.  Feels different than the gout he has had in the past given the location, and pain is not quite as severe. Reports pain not helped by tylenol, worse with movements, worse with ambulation.  Denies fevers, redness, numbness or other concerns.  No history of injury.  Has high blood pressure, but denies chest pain, shortness of breath, numbness, weakness, difficulty speaking, change in vision.  From A/P: 38 year old male with a history of gout and hypertension presents with concern for left ankle pain and swelling.  Strong pulses bilaterally, no sign of acute arterial thrombus.  Does not have swelling to the lower leg or calf, no pain radiating upwards, pain is located to the ankle, and I have a low suspicion for DVT.  He denies DVT risk factors on my history.  No erythema, good range of motion, no fever, low suspicion for septic arthritis.  He has been out of a few of his blood pressure medications in the setting of losing his job and insurance.  He denies symptoms, has no sign of blood pressure emergency.  Given he has not had any recent lab work done, creatinine was checked which is 1.94, and suspect patient does have chronic kidney disease.  Discussed both importance of blood pressure control, and avoidance of other renal insults, including avoiding NSAIDs.  Provided patient with prescription for his regular  medications, and case management was consulted.  Case management will set up a PCP appointment for him and he will have outpatient lab monitoring. I suspect his left ankle pain was likely secondary to gout.  Reviewed in [place] drug database, with no prior narcotic prescriptions, and discussed risks of tramadol.  Given he is unable to take NSAIDs due to his renal disease, I have written a prescription for tramadol and recommended full dose Tylenol as well.  Also gave prescription for prednisone for 4 days, with discussion of risks of hyperglycemia. Patient discharged in stable condition with understanding of reasons to return.

## 2019-05-19 ENCOUNTER — Inpatient Hospital Stay: Payer: Self-pay

## 2019-12-03 IMAGING — DX LEFT ANKLE COMPLETE - 3+ VIEW
3 series · 3 of 3 positions shown · non-contrast
Comparison: None.

CLINICAL DATA: Left ankle pain a few days.  No injury.

EXAM:
LEFT ANKLE COMPLETE - 3+ VIEW

[ankle ap]
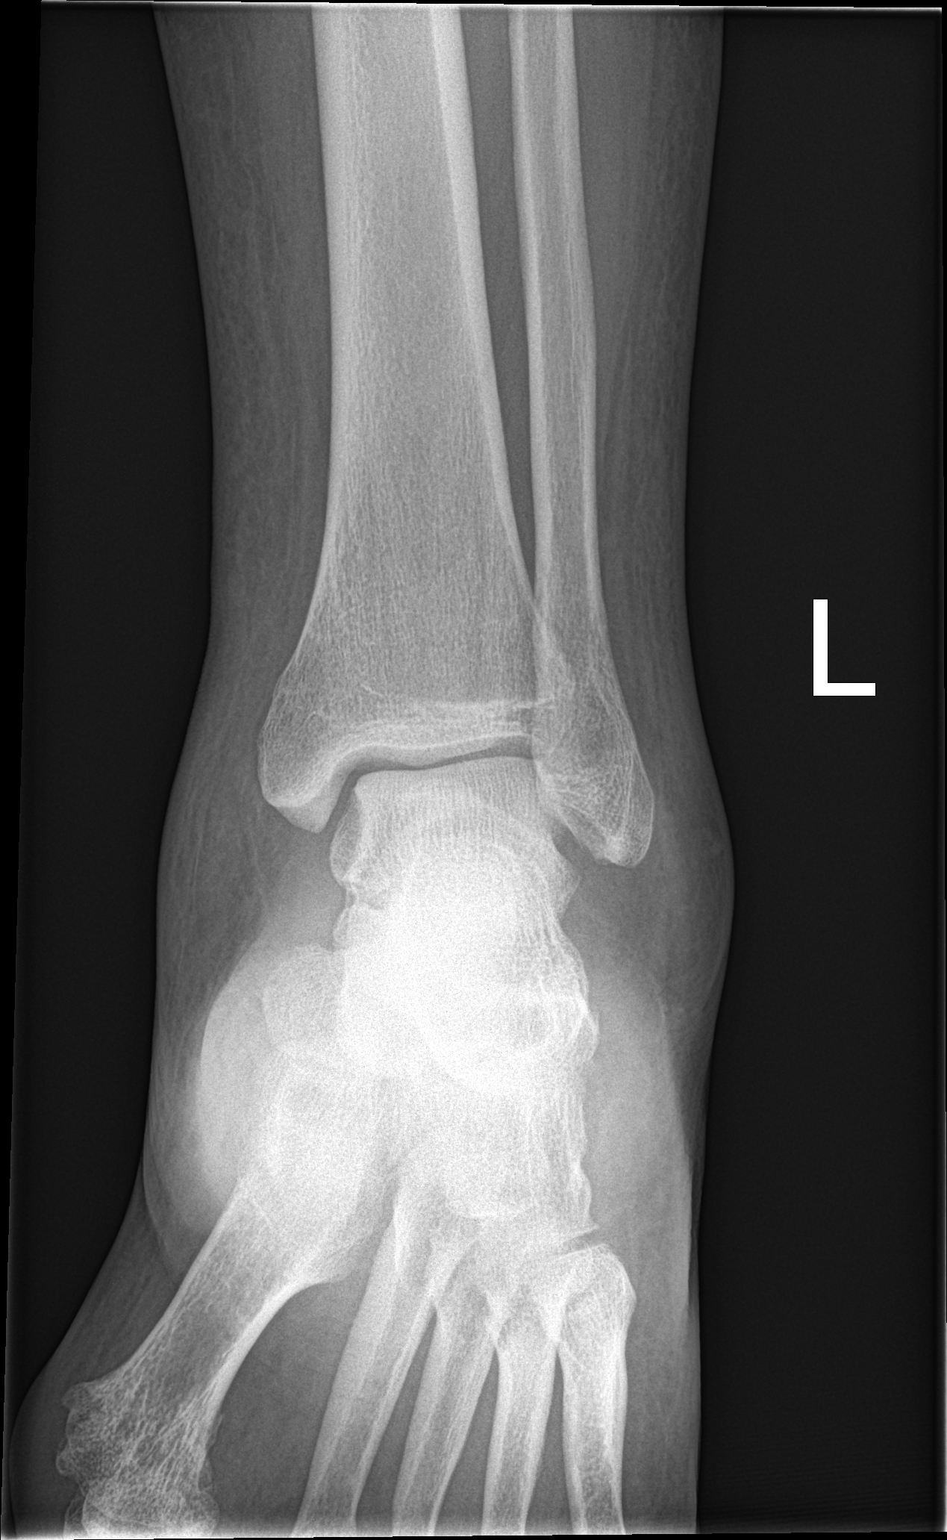

[ankle obl]
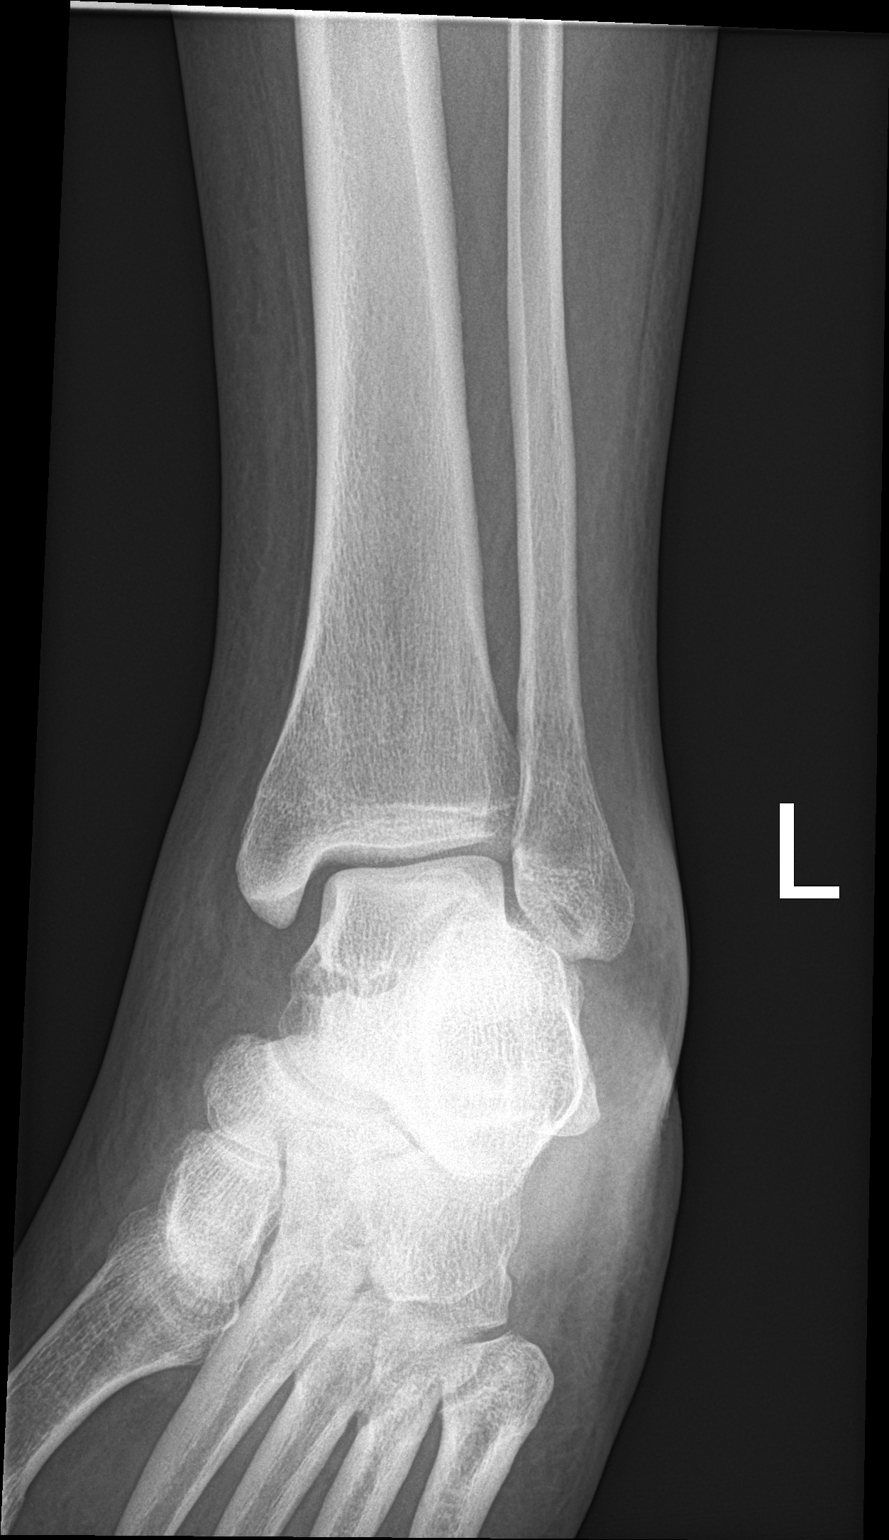

[ankle lat]
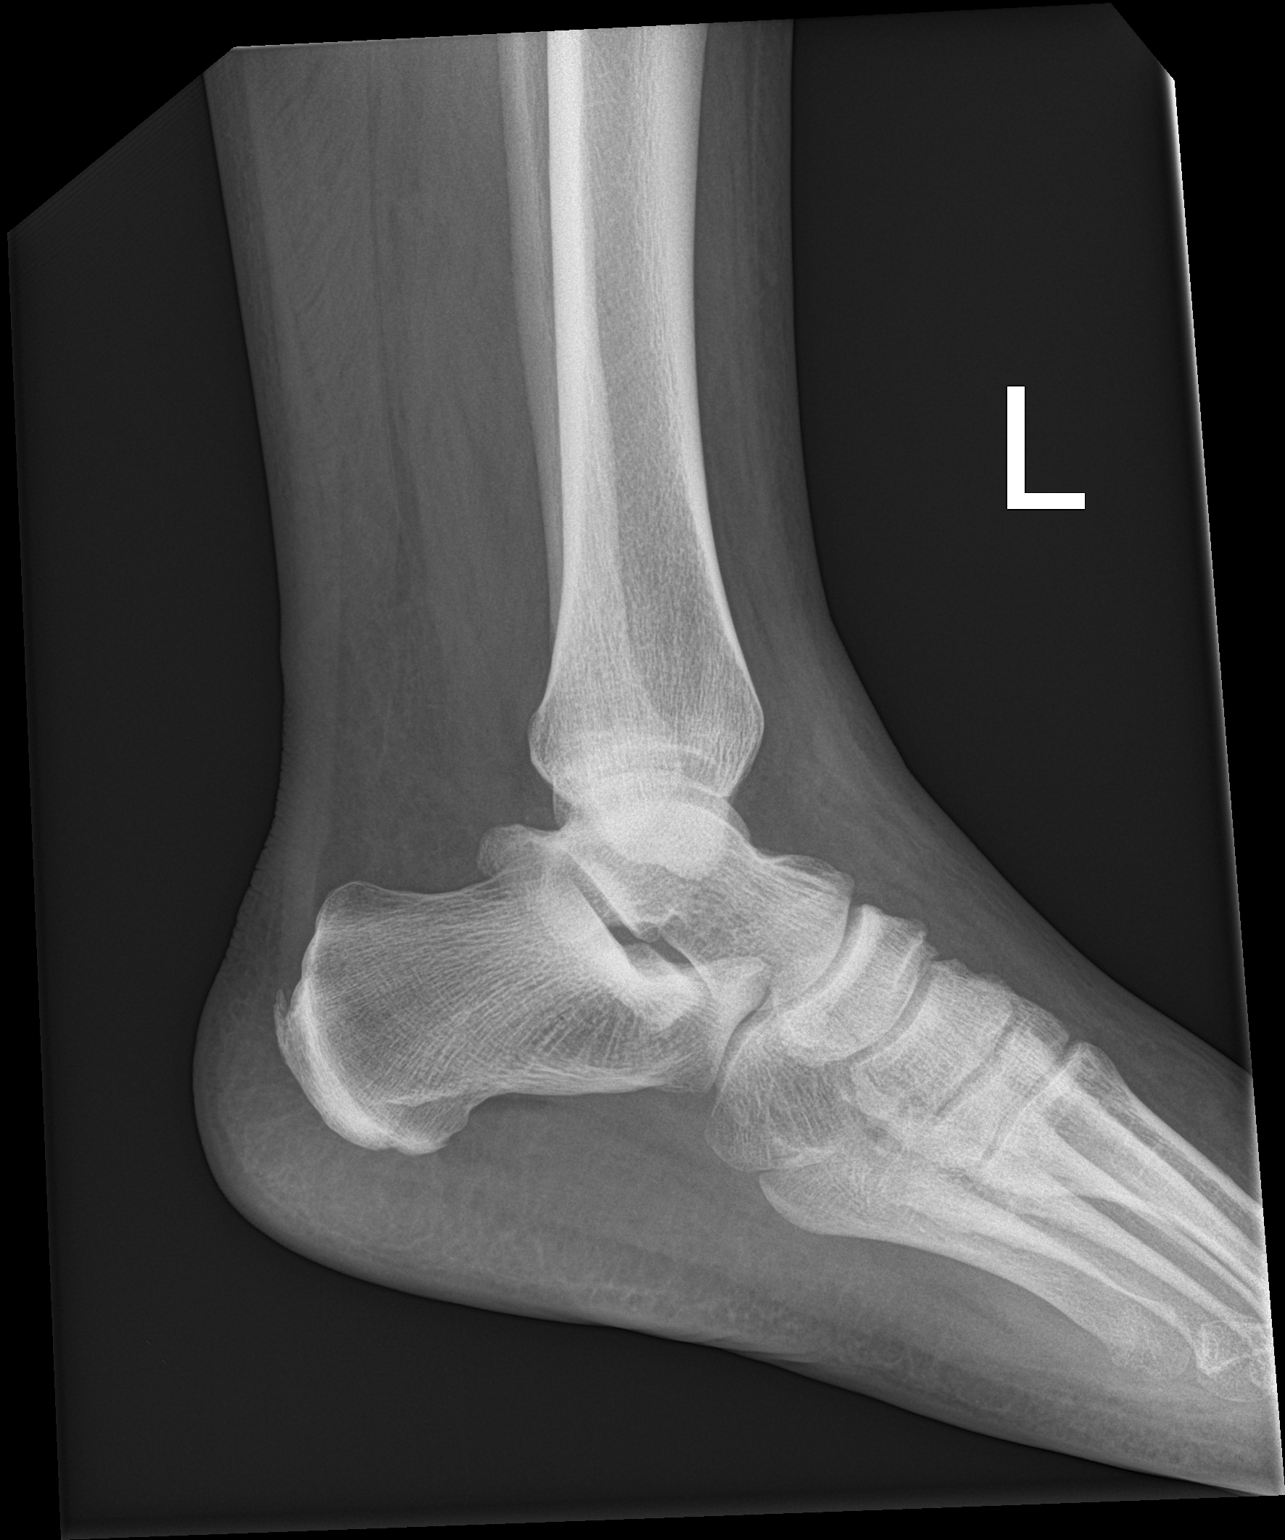

[3 of 3 positions shown; findings below may reference images not displayed]

FINDINGS: Diffuse soft tissue swelling over the left ankle. Ankle mortise is
normal. No evidence of acute fracture or dislocation. Remainder of
the exam is unremarkable.
IMPRESSION: Diffuse soft tissue swelling.  No bony abnormality.

## 2019-12-20 ENCOUNTER — Emergency Department (HOSPITAL_BASED_OUTPATIENT_CLINIC_OR_DEPARTMENT_OTHER): Payer: Self-pay

## 2019-12-20 ENCOUNTER — Other Ambulatory Visit: Payer: Self-pay

## 2019-12-20 ENCOUNTER — Emergency Department (HOSPITAL_BASED_OUTPATIENT_CLINIC_OR_DEPARTMENT_OTHER)
Admission: EM | Admit: 2019-12-20 | Discharge: 2019-12-20 | Disposition: A | Discharge status: Home / Self Care | Payer: Self-pay | Attending: Emergency Medicine | Admitting: Emergency Medicine

## 2019-12-20 ENCOUNTER — Encounter (HOSPITAL_BASED_OUTPATIENT_CLINIC_OR_DEPARTMENT_OTHER): Payer: Self-pay

## 2019-12-20 DIAGNOSIS — I4891 Unspecified atrial fibrillation: Secondary | ICD-10-CM | POA: Insufficient documentation

## 2019-12-20 DIAGNOSIS — I10 Essential (primary) hypertension: Secondary | ICD-10-CM | POA: Insufficient documentation

## 2019-12-20 DIAGNOSIS — Z79899 Other long term (current) drug therapy: Secondary | ICD-10-CM | POA: Insufficient documentation

## 2019-12-20 HISTORY — DX: Prediabetes: R73.03

## 2019-12-20 HISTORY — DX: Pure hypercholesterolemia, unspecified: E78.00

## 2019-12-20 LAB — BASIC METABOLIC PANEL
Anion gap: 11 (ref 5–15)
BUN: 17 mg/dL (ref 6–20)
CO2: 20 mmol/L — ABNORMAL LOW (ref 22–32)
Calcium: 9.5 mg/dL (ref 8.9–10.3)
Chloride: 104 mmol/L (ref 98–111)
Creatinine, Ser: 1.8 mg/dL — ABNORMAL HIGH (ref 0.61–1.24)
GFR calc Af Amer: 54 mL/min — ABNORMAL LOW (ref >60)
GFR calc non Af Amer: 46 mL/min — ABNORMAL LOW (ref >60)
Glucose, Bld: 179 mg/dL — ABNORMAL HIGH (ref 70–99)
Potassium: 3.9 mmol/L (ref 3.5–5.1)
Sodium: 135 mmol/L (ref 135–145)

## 2019-12-20 LAB — CBC
HCT: 47.3 % (ref 39.0–52.0)
Hemoglobin: 15.3 g/dL (ref 13.0–17.0)
MCH: 27.9 pg (ref 26.0–34.0)
MCHC: 32.3 g/dL (ref 30.0–36.0)
MCV: 86.2 fL (ref 80.0–100.0)
Platelets: 245 10*3/uL (ref 150–400)
RBC: 5.49 MIL/uL (ref 4.22–5.81)
RDW: 13.4 % (ref 11.5–15.5)
WBC: 13.1 10*3/uL — ABNORMAL HIGH (ref 4.0–10.5)
nRBC: 0 % (ref 0.0–0.2)

## 2019-12-20 LAB — TROPONIN I (HIGH SENSITIVITY)
Troponin I (High Sensitivity): 13 ng/L (ref <18)
Troponin I (High Sensitivity): 12 ng/L (ref <18)
Troponin I (High Sensitivity): 8 ng/L (ref <18)

## 2019-12-20 LAB — TSH: TSH: 3.349 u[IU]/mL (ref 0.350–4.500)

## 2019-12-20 MED ORDER — SODIUM CHLORIDE 0.9% FLUSH
3.0000 mL | Freq: Once | INTRAVENOUS | Status: DC
  Filled 2019-12-20: qty 3

## 2019-12-20 MED ORDER — RIVAROXABAN 20 MG PO TABS: 20.0000 mg | ORAL_TABLET | Freq: Every day | ORAL | 0 refills | Status: DC

## 2019-12-20 MED ORDER — RIVAROXABAN 20 MG PO TABS: 20.0000 mg | ORAL_TABLET | Freq: Every day | ORAL | 0 refills | Status: AC

## 2019-12-20 NOTE — ED Triage Notes (Signed)
Pt c/o CP started yesterday-left upper back pain started 2 days ago-NAD-steady slow gait

## 2019-12-20 NOTE — ED Notes (Signed)
EKG to EDP [place] 

## 2019-12-20 NOTE — Discharge Instructions (Signed)
Call the [facility] for a follow-up visit if possible.  Return to the ER sooner if your heart rate goes quickly.

## 2019-12-20 NOTE — ED Provider Notes (Signed)
[facility] EMERGENCY DEPARTMENT Provider Note   CSN: [id number] Arrival date & time: 12/20/19  1642     History Chief Complaint  Patient presents with  . Chest Pain    Michael Hatfield is a 39 y.o. male.  HPI Patient presents with chest pain.  Left upper chest goes to the back.  Started around 2 days ago.  Began after wrestling with his nephew.  States worse with movements.  Worse with palpation.  Sometimes will feel his heart going fast.  Pain not worse with exertion but is worse with movements.  No fevers or chills.  No coughing.  Not been around anyone sick.  History of hypertension is on medicines.  Never told he has had an irregular heartbeat.     Past Medical History:  Diagnosis Date  . Gout   . High cholesterol   . Hypertension   . Prediabetes     There are no problems to display for this patient.   History reviewed. No pertinent surgical history.     No family history on file.  Social History   Tobacco Use  . Smoking status: Never Smoker  . Smokeless tobacco: Never Used  Substance Use Topics  . Alcohol use: No  . Drug use: Yes    Types: Marijuana    Home Medications Prior to Admission medications   Medication Sig Start Date End Date Taking? Authorizing Provider  carvedilol (COREG) 12.5 MG tablet Take 1 tablet (12.5 mg total) by mouth 2 (two) times daily with a meal for 30 days. 05/08/19 06/07/19  Alvira Monday, MD  hydrochlorothiazide (HYDRODIURIL) 25 MG tablet Take 1 tablet (25 mg total) by mouth daily for 30 days. 05/08/19 06/07/19  Alvira Monday, MD  indomethacin (INDOCIN) 50 MG capsule Take 1 capsule (50 mg total) by mouth 2 (two) times daily with a meal. 08/08/16   Felicie Morn, NP  lisinopril (ZESTRIL) 20 MG tablet Take 1 tablet (20 mg total) by mouth daily for 30 days. 05/08/19 06/07/19  Alvira Monday, MD  metFORMIN (GLUCOPHAGE) 500 MG tablet Take 1 tablet (500 mg total) by mouth 2 (two) times daily with a meal for 30 days. 05/08/19 06/07/19   Alvira Monday, MD  rivaroxaban (XARELTO) 20 MG TABS tablet Take 1 tablet (20 mg total) by mouth daily with supper. 12/20/19   Benjiman Core, MD  simvastatin (ZOCOR) 10 MG tablet Take 1 tablet (10 mg total) by mouth daily for 30 days. 05/08/19 06/07/19  Alvira Monday, MD  traMADol (ULTRAM) 50 MG tablet Take 1 tablet (50 mg total) by mouth every 6 (six) hours as needed. 05/08/19   Alvira Monday, MD    Allergies    Patient has no known allergies.  Review of Systems   Review of Systems  Constitutional: Negative for appetite change.  HENT: Negative for congestion.   Respiratory: Negative for shortness of breath.   Cardiovascular: Positive for chest pain. Negative for palpitations.  Gastrointestinal: Negative for abdominal pain.  Genitourinary: Negative for flank pain.  Musculoskeletal: Negative for back pain.  Neurological: Negative for weakness.  Psychiatric/Behavioral: Negative for confusion.    Physical Exam Updated Vital Signs BP 101/87   Pulse (!) 101   Temp 99 F (37.2 C) (Oral)   Resp 15   SpO2 100%   Physical Exam Vitals reviewed.  Cardiovascular:     Rate and Rhythm: Tachycardia present. Rhythm irregular.  Pulmonary:     Breath sounds: No wheezing or rhonchi.  Chest:     Chest  wall: Tenderness present.     Comments: Tenderness to left anterior upper chest wall.  Worse with movements.  Worse with palpation. Musculoskeletal:     Cervical back: Neck supple.     Right lower leg: No tenderness. No edema.     Left lower leg: No tenderness. No edema.  Skin:    General: Skin is warm.     Capillary Refill: Capillary refill takes less than 2 seconds.  Neurological:     Mental Status: He is alert.     ED Results / Procedures / Treatments   Labs (all labs ordered are listed, but only abnormal results are displayed) Labs Reviewed  BASIC METABOLIC PANEL - Abnormal; Notable for the following components:      Result Value   CO2 20 (*)    Glucose, Bld 179 (*)     Creatinine, Ser 1.80 (*)    GFR calc non Af Amer 46 (*)    GFR calc Af Amer 54 (*)    All other components within normal limits  CBC - Abnormal; Notable for the following components:   WBC 13.1 (*)    All other components within normal limits  TSH  TROPONIN I (HIGH SENSITIVITY)  TROPONIN I (HIGH SENSITIVITY)  TROPONIN I (HIGH SENSITIVITY)    EKG EKG Interpretation  Date/Time:  Tuesday December 20 2019 19:01:45 EST Ventricular Rate:  113 PR Interval:    QRS Duration: 98 QT Interval:  351 QTC Calculation: 453 R Axis:   -38 Text Interpretation: Atrial fibrillation Ventricular premature complex Left axis deviation Abnormal R-wave progression, late transition Confirmed by Benjiman Core [phone]) on 12/20/2019 7:12:30 PM   Radiology DG Chest 2 View  Result Date: 12/20/2019 CLINICAL DATA:  Chest pain EXAM: CHEST - 2 VIEW COMPARISON:  February 25, 2008 FINDINGS: Lungs are clear. Heart size and pulmonary vascular normal. No adenopathy. No pneumothorax. There is an old healed fracture of the lateral left clavicle, stable. IMPRESSION: Lungs clear.  No adenopathy. Electronically Signed   By: Bretta Bang III M.D.   On: 12/20/2019 17:31    Procedures Procedures (including critical care time)  Medications Ordered in ED Medications  sodium chloride flush (NS) 0.9 % injection 3 mL (has no administration in time range)    ED Course  I have reviewed the triage vital signs and the nursing notes.  Pertinent labs & imaging results that were available during my care of the patient were reviewed by me and considered in my medical decision making (see chart for details).    MDM Rules/Calculators/A&P                      Patient presents with left-sided chest pain.  I think this is likely musculoskeletal.  Troponin negative x3.  Tender on the left chest.  Began after wrestling.  However found to be new onset atrial fibrillation.  Rate overall controlled but does have some mild rapid rate at  time.  Already on Coreg 25 twice daily.  CHA2DS2-VASc score of 1 or 2.  Has hypertension but questionable diabetes history.  However will start on anticoagulation this patient potentially would get a cardioversion.  Follow-up with [facility] clinic as soon as possible. Final Clinical Impression(s) / ED Diagnoses Final diagnoses:  Atrial fibrillation, unspecified type (HCC)    Rx / DC Orders ED Discharge Orders         Ordered    rivaroxaban (XARELTO) 20 MG TABS tablet  Daily with supper  12/20/19 2048           Davonna Belling, MD 12/20/19 2052

## 2019-12-21 ENCOUNTER — Telehealth (HOSPITAL_COMMUNITY): Payer: Self-pay | Admitting: Physician Assistant

## 2019-12-21 NOTE — Telephone Encounter (Signed)
We received referral from [facility] to schedule pt.  Called and left message for patient to call [facility] to schedule ED f/u appt.

## 2020-07-16 IMAGING — CR DG CHEST 2V
2 series · 2 of 2 positions shown · non-contrast
Comparison: February 25, 2008

CLINICAL DATA: Chest pain

EXAM:
CHEST - 2 VIEW

[w chest pa]
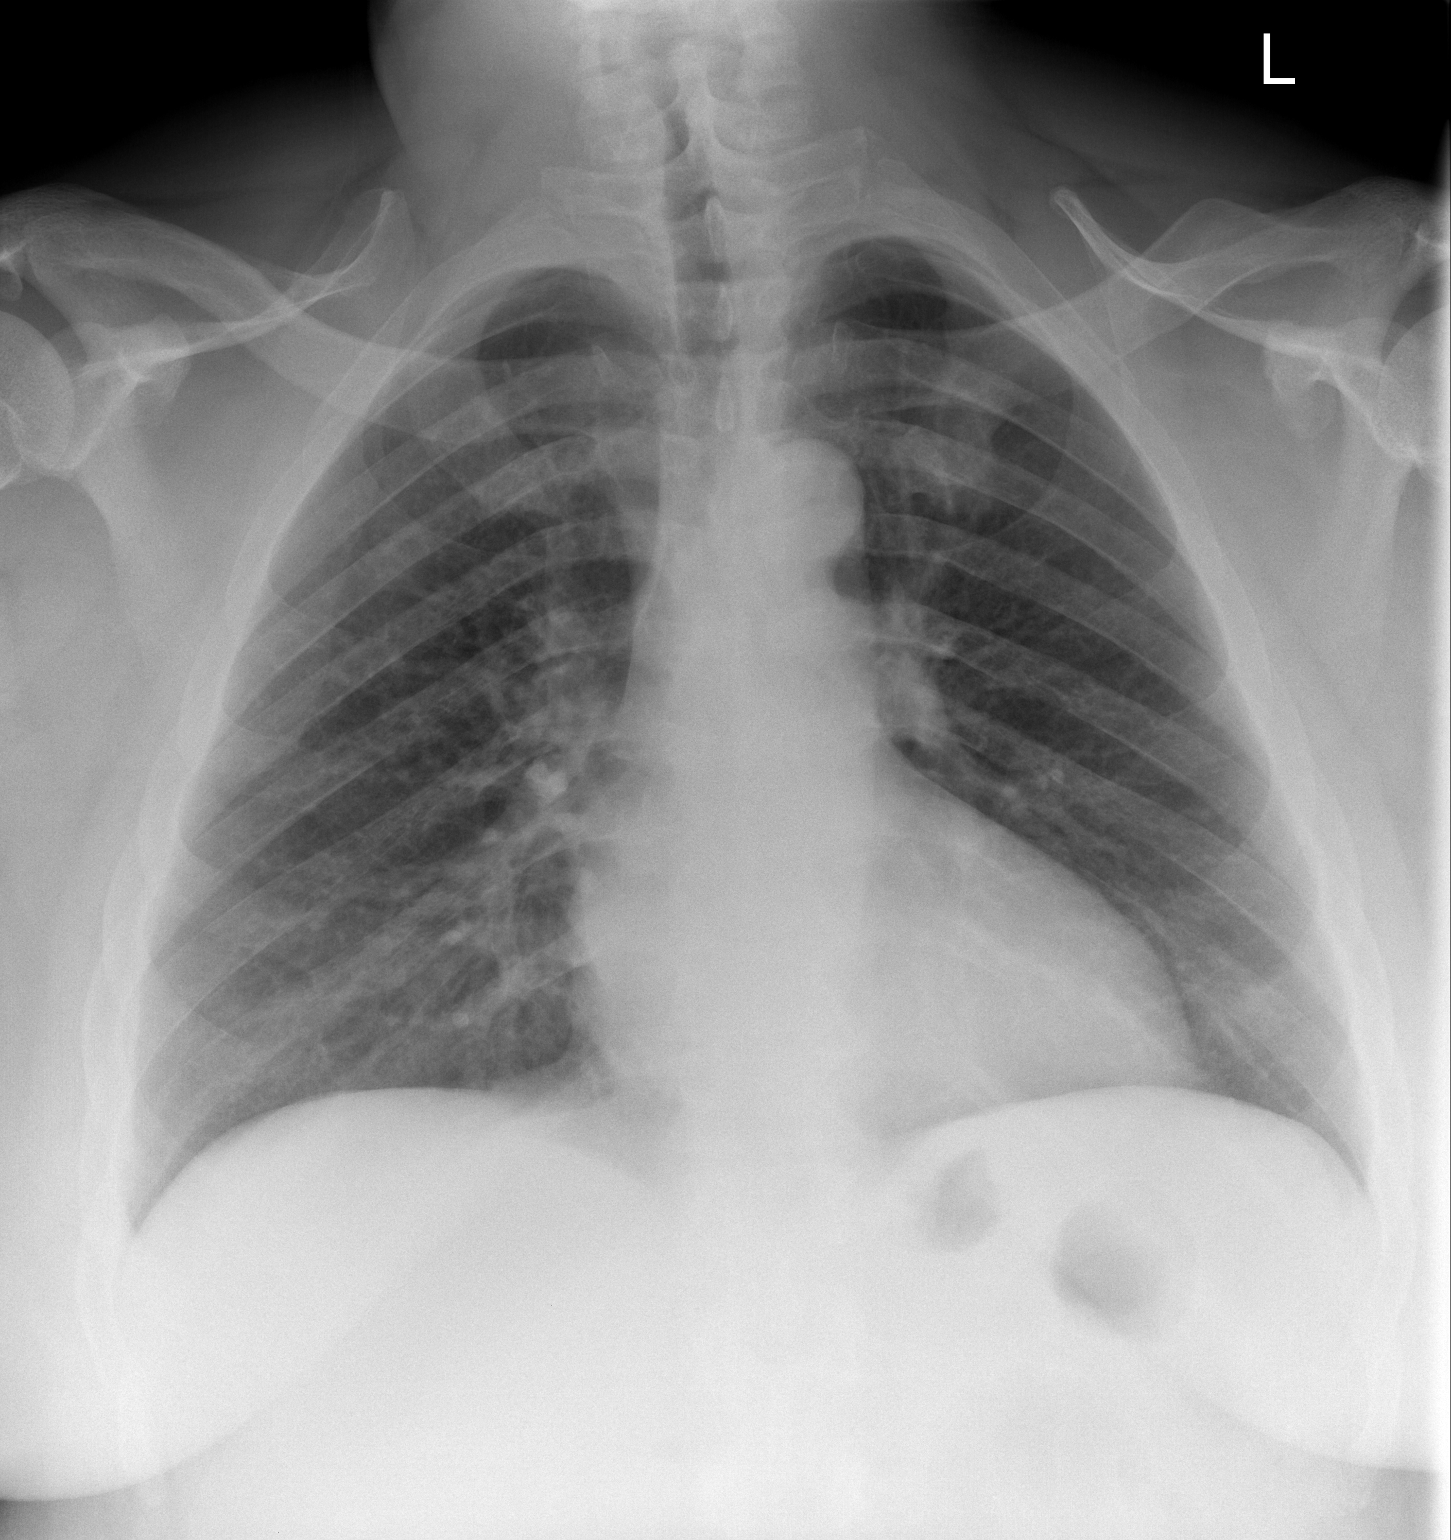

[w chest lat]
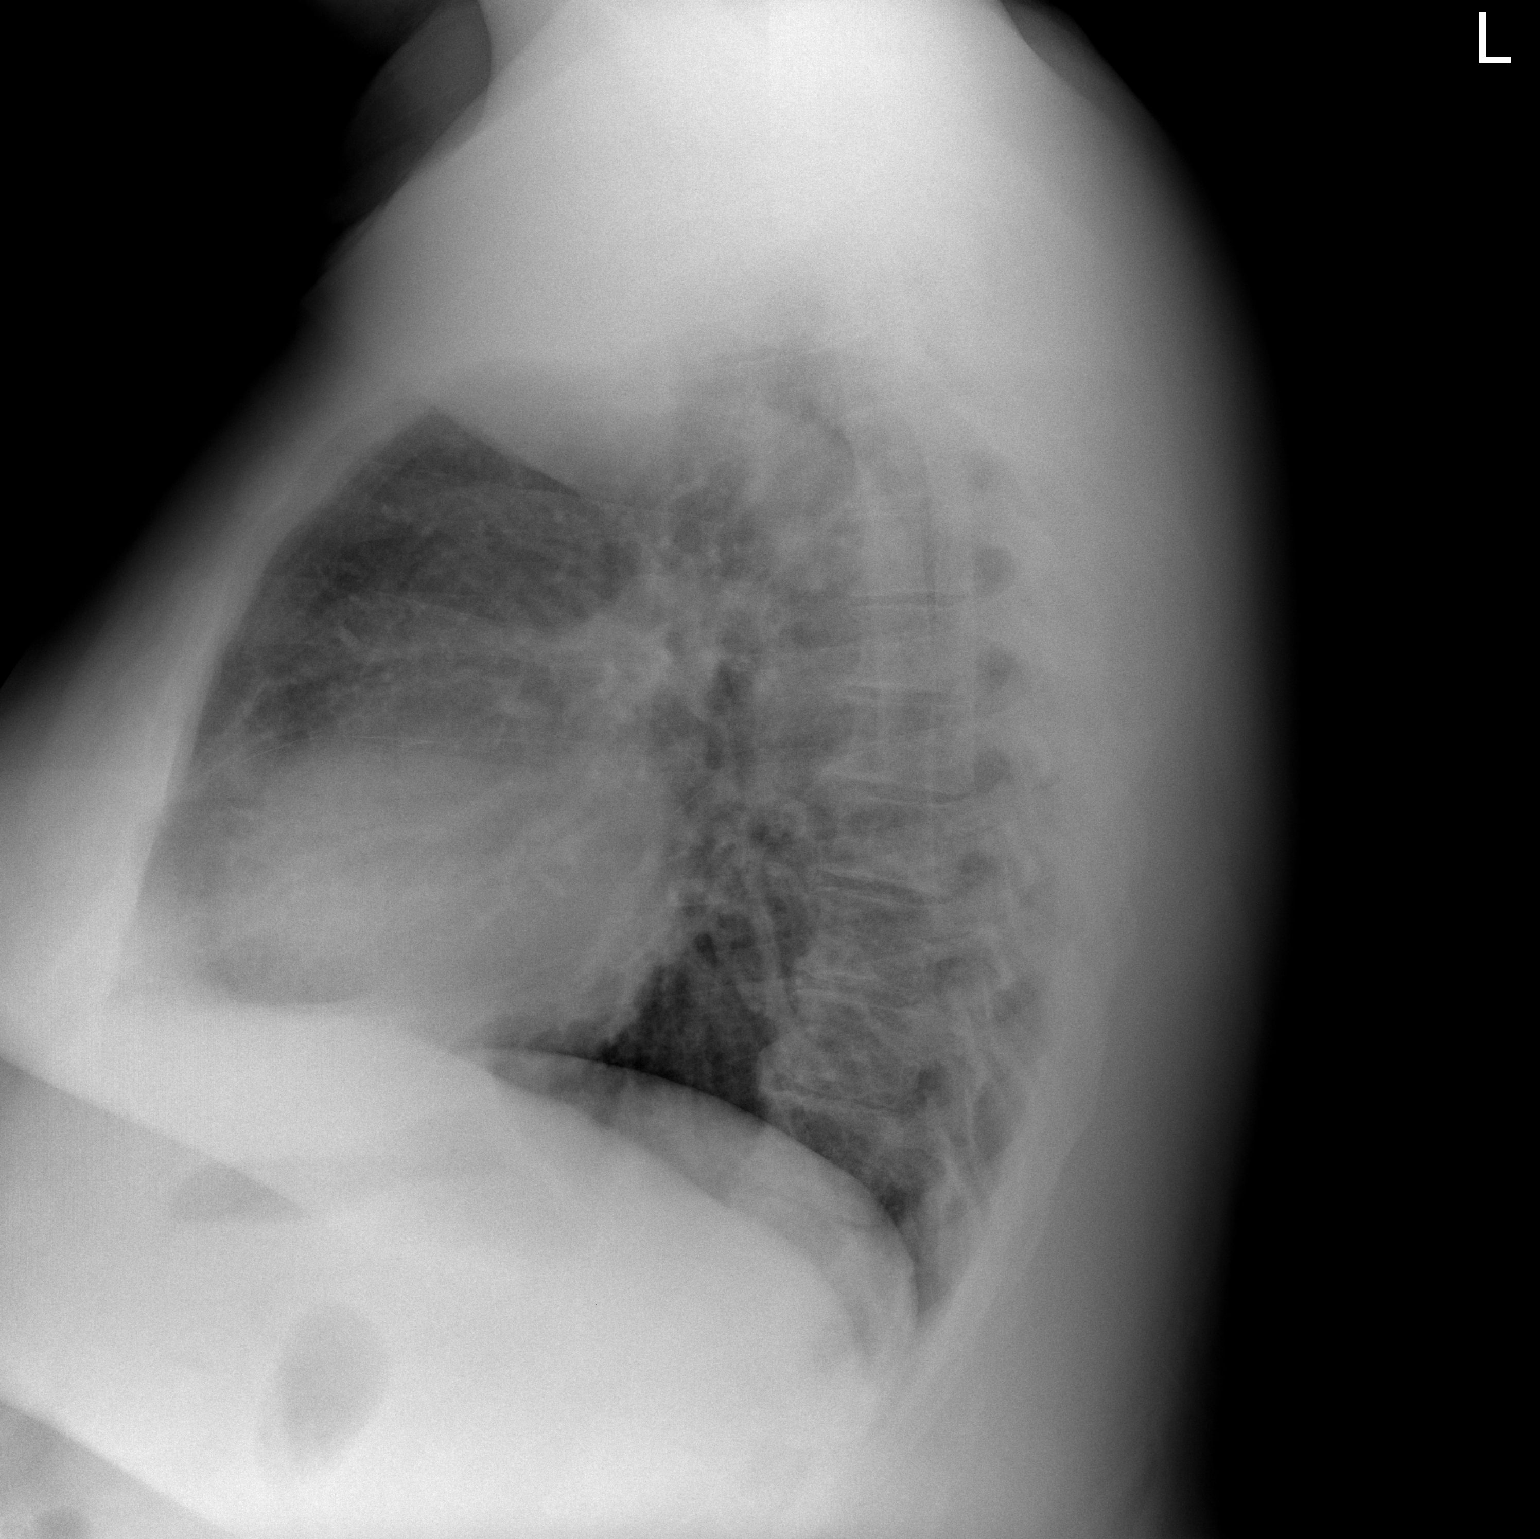

[2 of 2 positions shown; findings below may reference images not displayed]

FINDINGS: Lungs are clear. Heart size and pulmonary vascular normal. No
adenopathy. No pneumothorax. There is an old healed fracture of the
lateral left clavicle, stable.
IMPRESSION: Lungs clear.  No adenopathy.

## 2020-09-27 ENCOUNTER — Other Ambulatory Visit: Primary: Student in an Organized Health Care Education/Training Program

## 2020-09-27 ENCOUNTER — Encounter

## 2020-09-27 ENCOUNTER — Inpatient Hospital Stay: Admit: 2020-09-27 | Primary: Student in an Organized Health Care Education/Training Program

## 2020-09-27 DIAGNOSIS — Z0184 Encounter for antibody response examination: Secondary | ICD-10-CM

## 2020-09-27 NOTE — Telephone Encounter (Signed)
New to area      Future Appointments   Date Time Provider Department Center   10/01/2020  8:30 AM Rannie, Lanora Manis, MD [facility]       Pt bring roi to ck in.

## 2020-09-27 NOTE — Telephone Encounter (Signed)
HIN reviewed and there is only one urgent care note where patient is requesting refills. Moved back to [place] from [place] area. No records to abstract. I added HTN, high cholesterol and diabetes based off of the medications sent in by urgent care provider.     Patient will need ROI at check in and then I can abstract records once we get them in.

## 2020-09-28 LAB — RUBELLA AB, IGG
Rubella IgG, QL: UNDETERMINED — AB
Rubella, Qualitative, IgG: UNDETERMINED — AB

## 2020-09-29 LAB — HEP B SURFACE AB
HEP B SURFACE AB QT.: <5 m[IU]/mL
HEP B SURFACE AB: NEGATIVE
Hep B Surface Ab Qt.: <5 m[IU]/mL
Hep B Surface Ab: NEGATIVE

## 2020-09-29 LAB — RUBEOLA AB, IGG
Rubeola Ab Index: 0.7
Rubeola Ab Index: 0.7 NA
Rubeola Ab, IgG: NEGATIVE
Rubeola Antibody, IgG: NEGATIVE

## 2020-09-29 LAB — VZV AB, IGG
VZV Ab, IgG: POSITIVE
VZV IgG Ab Index: 5.2
VZV IgG Antibody Index: 5.2 NA
Vzv Antigen, IgG: POSITIVE

## 2020-09-29 LAB — MUMPS VIRUS IGG
Mumps Ab, IgG: POSITIVE
Mumps Antibody, IgG: POSITIVE
Mumps IgG Ab Index: 2.5
Mumps IgG Ab Index: 2.5 NA

## 2020-10-01 ENCOUNTER — Ambulatory Visit
Attending: Student in an Organized Health Care Education/Training Program | Primary: Student in an Organized Health Care Education/Training Program

## 2020-10-01 ENCOUNTER — Ambulatory Visit
Admit: 2020-10-01 | Discharge: 2020-10-01 | Attending: Student in an Organized Health Care Education/Training Program | Primary: Student in an Organized Health Care Education/Training Program

## 2020-10-01 DIAGNOSIS — E119 Type 2 diabetes mellitus without complications: Secondary | ICD-10-CM

## 2020-10-01 MED ORDER — METFORMIN 1,000 MG TAB
1000 mg | ORAL_TABLET | Freq: Two times a day (BID) | ORAL | 3 refills | Status: DC
Start: 2020-10-01 — End: 2021-05-13

## 2020-10-01 MED ORDER — CARVEDILOL 25 MG TAB
25 mg | ORAL_TABLET | Freq: Two times a day (BID) | ORAL | 3 refills | Status: DC
Start: 2020-10-01 — End: 2020-12-12

## 2020-10-01 MED ORDER — ATORVASTATIN 40 MG TAB
40 mg | ORAL_TABLET | Freq: Every day | ORAL | 3 refills | Status: DC
Start: 2020-10-01 — End: 2021-06-27

## 2020-10-01 MED ORDER — LISINOPRIL 40 MG TAB
40 mg | ORAL_TABLET | Freq: Every day | ORAL | 3 refills | Status: DC
Start: 2020-10-01 — End: 2021-06-27

## 2020-10-01 MED ORDER — AMLODIPINE 10 MG TAB
10 mg | ORAL_TABLET | Freq: Every day | ORAL | 3 refills | Status: DC
Start: 2020-10-01 — End: 2021-06-27

## 2020-10-01 MED ORDER — METFORMIN ER 1,000 MG 24 HR TABLET,EXTENDED RELEASE
1000 mg | ORAL_TABLET | Freq: Every day | ORAL | 3 refills | Status: DC
Start: 2020-10-01 — End: 2020-10-01

## 2020-10-01 NOTE — Progress Notes (Signed)
The patient was counseled on the dangers of tobacco use, and was advised to quit.  Reviewed strategies to maximize success, including written materials.

## 2020-10-01 NOTE — Progress Notes (Signed)
[facility] [address]   [address] [place] [address]  [phone]    CHIEF COMPLAINT   Peter Mueller is a 39 y.o. male who presents to clinic for New Patient and Hypertension.    HISTORY OF PRESENT ILLNESS   Reviewed past medical, surgical, allergy, medication, family, and social histories.    # Metabolic syndrome (insulin resistance, obesity, hyperlipidemia)  - Diagnosed with DM2 around 2017-2018; initially told "prediabtes" but was increased on metformin since then  - Had blood work within the past 6 MO but unable to access the results  - Completed ROI at today's visit  - Has never had retinopathy screening  - Denies decreased visual acuity, polyuria, polydipsia, neuropathy  - Motivated to lose weight; going to the gym with a friend 5 days / week  - Does 20-30 min on the treadmill followed by 20-40 min of weight lifting    # Hypertension  - Measures BP at home with wrist cuff  - Typically ranges in the 130s/80-90s with medications  - Ran out of his medications yesterday after being given an emergency supply by [employer]  - Endorses some lower extremity edema    # Smoking cessation  - Started smoking age 1  - Has cut down to 5-7 cigarettes per day currently  - Interested in quitting completely  - Has not tried nicotine replacement or medication assistance    REVIEW OF SYSTEMS     As per HPI.    PAST MEDICAL & SURGICAL HISTORY     Past Medical History:   Diagnosis Date   ??? DM2 (diabetes mellitus, type 2) (HCC)    ??? HLD (hyperlipidemia)    ??? HTN (hypertension)    ??? Morbid obesity (HCC)      No past surgical history on file.     MEDICATIONS     Current Outpatient Medications   Medication Sig   ??? lisinopriL (PRINIVIL, ZESTRIL) 40 mg tablet Take 1 Tablet by mouth daily.   ??? atorvastatin (LIPITOR) 40 mg tablet Take 1 Tablet by mouth daily.   ??? carvediloL (COREG) 25 mg tablet Take 1 Tablet by mouth two (2) times daily (with meals).   ??? amLODIPine (NORVASC) 10 mg tablet Take 1 Tablet by mouth  daily.   ??? metFORMIN (GLUCOPHAGE) 1,000 mg tablet Take 1 Tablet by mouth two (2) times daily (with meals).     No current facility-administered medications for this visit.     ALLERGIES     Allergies   Allergen Reactions   ??? Nifedipine Unknown (comments)     Allergy listed in HIN- no reaction noted.      FAMILY & SOCIAL HISTORY     Family History   Problem Relation Age of Onset   ??? Hypertension Mother    ??? Hypertension Father    ??? Diabetes Father    ??? Stroke Paternal Uncle         Age 16s     Social History     Social History Narrative    Lives in [place], [place].  [place] from [place].  Originally from [place], [place].  Works at [employer].  Single, no children.  Current smoker of 5-7 cigarettes per day (started age 50).  Occasional alcohol.  Daily cannabis use.  No hard drugs.        PHYSICAL EXAM     Visit Vitals  BP (!) 142/100 (BP 1 Location: Right arm, BP Patient Position: Sitting, BP Cuff  Size: Large adult)   Pulse 76   Ht 6' 2.5" (1.892 m)   Wt (!) 362 lb 8 oz (164.4 kg)   SpO2 99%   BMI 45.92 kg/m??     GENERAL:  Appears well; no acute distress  EYES:  PERRL; EOMI; conjunctiva and sclera clear  EARS/NOSE/THROAT:  External auditory canals clear with grossly normal hearing; TMs clear BL; oropharynx without lesions; MMM; fair dentition  NECK:  Supple; no masses, thyromegaly, or adenopathy  LUNGS:  Non-labored breathing; CTA without wheezes, rales, or rhonchi  CARDIAC:  RRR; normal S1/S2; no murmurs; BL LEs without edema  ABDOMEN:  Central obesity  SKIN:  Limited exam - tattoos; no rashes or lesions  MUSCULOSKELETAL:  Normal muscle tone, bulk, and strength; no localized tenderness or swelling; FROM of all extremities  NEUROLOGIC:  No gross FND; strength and sensation grossly intact in all 4 extremities  PSYCHIATRIC:  Appropriate affect; makes good eye contact; good judgement and insight; normal concentration and attention    ASSESSMENT AND PLAN     1. Type 2 diabetes mellitus without complication, without long-term current use  of insulin (HCC)  Most recent A1C unknown.  As the patient is self-pay, we did not pursue any additional testing.  The patient should have insurance by Dec 2021.  Scheduled F/U for Jan 2022.  Refilled atorvastatin metformin; sent RX for IR and ER (preferable, but may not be affordable).  We will conduct a full DM2 visit at that time.  Commended patient on efforts towards WL, which will help improve his insulin resistance.  - atorvastatin (LIPITOR) 40 mg tablet; Take 1 Tablet by mouth daily.  Dispense: 90 Tablet; Refill: 3  - metFORMIN (GLUCOPHAGE) 1,000 mg tablet; Take 1 Tablet by mouth two (2) times daily (with meals).  Dispense: 90 Tablet; Refill: 3    2. Primary hypertension  Did not take medications this AM.  BP 142/100 today.  Refilled all 3 BP medications below, no adjustments made.  Patient encouraged to continue monitoring his BP at home.  Commended patient on efforts towards WL, which will help lower his BP.  F/U Jan 2022.  - lisinopriL (PRINIVIL, ZESTRIL) 40 mg tablet; Take 1 Tablet by mouth daily.  Dispense: 90 Tablet; Refill: 3  - carvediloL (COREG) 25 mg tablet; Take 1 Tablet by mouth two (2) times daily (with meals).  Dispense: 90 Tablet; Refill: 3  - amLODIPine (NORVASC) 10 mg tablet; Take 1 Tablet by mouth daily.  Dispense: 90 Tablet; Refill: 3    3. Morbid obesity (HCC)  Exercising 1 hr x 5 days / week.  Encouraged to keep this up.  He may benefit from an injectable such as Wegovy or other GLP-1 (depending on DM2 control), but these would be unaffordable at this time.  F/U Jan 2022.    4. Encounter for smoking cessation counseling  Patient was counseled on the dangers of tobacco use, and was encouraged to quit.  Discussed overall health benefits of smoking cessation to include decreased risk of heart disease, hypertension, and COPD, as well as improved lung function and improved healing.  Reviewed strategies to maximize success, including removing of cigarettes and smoking materials from the  environment, stress management, enlisting support from family and friends, written materials, referral to a local smoking cessation program / support group, pharmacotherapy, and nicotine replacement aids (patches, gum, lozenges, inhaler).  Patient made aware of the [place] [facility], which he plans to call.  F/U Jan 2022.  Karsten Ro, MD  10/01/2020

## 2020-12-12 ENCOUNTER — Ambulatory Visit
Attending: Student in an Organized Health Care Education/Training Program | Primary: Student in an Organized Health Care Education/Training Program

## 2020-12-12 ENCOUNTER — Ambulatory Visit
Admit: 2020-12-12 | Discharge: 2020-12-12 | Payer: PRIVATE HEALTH INSURANCE | Attending: Student in an Organized Health Care Education/Training Program | Primary: Student in an Organized Health Care Education/Training Program

## 2020-12-12 DIAGNOSIS — E119 Type 2 diabetes mellitus without complications: Secondary | ICD-10-CM

## 2020-12-12 LAB — AMB POC HEMOGLOBIN A1C
Hemoglobin A1C, POC: 7.1 %
Hemoglobin A1c (POC): 7.1 %

## 2020-12-12 MED ORDER — CARVEDILOL 25 MG TAB
25 mg | ORAL_TABLET | Freq: Two times a day (BID) | ORAL | 2 refills | Status: DC
Start: 2020-12-12 — End: 2021-07-22

## 2020-12-12 MED ORDER — NICOTINE (POLACRILEX) 2 MG GUM
2 mg | BUCCAL | 3 refills | Status: AC | PRN
Start: 2020-12-12 — End: 2021-01-11

## 2020-12-12 MED ORDER — NICOTINE (POLACRILEX) 2 MG GUM
2 mg | BUCCAL | 1 refills | Status: DC | PRN
Start: 2020-12-12 — End: 2020-12-12

## 2020-12-12 NOTE — Progress Notes (Signed)
The patient was counseled on the dangers of tobacco use, and was advised to quit.  Reviewed strategies to maximize success, including written materials.         Diabetic foot exam performed by Crystal L Hathorn     Measurement  Response Nurse Comment Physician Comment   Monofilament  R - 6/6  L - 6/6     Pulse DP R - present  L - present           Structural deformity R - None  L - None     Skin Integrity / Deformity R - None  L - None        Reviewed by:

## 2020-12-12 NOTE — Progress Notes (Signed)
[facility] [address]   [address] [place] [address]  [phone]    CHIEF COMPLAINT     Peter Mueller is a 40 y.o. male who presents to clinic for Hypertension, Diabetes, and Nicotine Dependence.    HISTORY OF PRESENT ILLNESS     # Diabetes mellitus 2   Diet:  Currently living with friends and has to eat what they eat or eat out; has lost 10 lbs since last visit  Exercise:  Exercising 3 days per week at the gym   Medications:  Takes metformin 1000 mg BID  Home glucose readings:  Does not measure  Last A1C:  Unknown  Current A1C:  7.1%  Retinopathy:  Has never had eye screening  Nephropathy:  On ACEi, due for nephropathy screening  Neuropathy:  Denies numbness, monofilament testing performed today  ASCVD:  Unable to calculate, obtaining lipid panel today  HLD:  On atorvastatin 40 mg daily, due for lipid screening  HTN:  BP 118/78 today  Tobacco:  Up to 7 cigarettes per day D/T stress of finding an apartment to live  Immunizations:  Due for PPSV23    REVIEW OF SYSTEMS     As per HPI.    PAST MEDICAL & SURGICAL HISTORY     Past Medical History:   Diagnosis Date   ??? DM2 (diabetes mellitus, type 2) (HCC)    ??? HLD (hyperlipidemia)    ??? HTN (hypertension)    ??? Morbid obesity (HCC)      History reviewed. No pertinent surgical history.     MEDICATIONS     Current Outpatient Medications   Medication Sig   ??? carvediloL (COREG) 25 mg tablet Take 1 Tablet by mouth two (2) times daily (with meals).   ??? nicotine (Nicorette) 2 mg gum Take 1 Each by mouth as needed for Smoking Cessation for up to 30 days.   ??? atorvastatin (LIPITOR) 40 mg tablet Take 1 Tablet by mouth daily.   ??? amLODIPine (NORVASC) 10 mg tablet Take 1 Tablet by mouth daily.   ??? metFORMIN (GLUCOPHAGE) 1,000 mg tablet Take 1 Tablet by mouth two (2) times daily (with meals).   ??? lisinopriL (PRINIVIL, ZESTRIL) 40 mg tablet Take 1 Tablet by mouth daily.     No current facility-administered medications for this visit.     ALLERGIES      Allergies   Allergen Reactions   ??? Nifedipine Unknown (comments)     Allergy listed in HIN- no reaction noted.      FAMILY & SOCIAL HISTORY     Family History   Problem Relation Age of Onset   ??? Hypertension Mother    ??? Hypertension Father    ??? Diabetes Father    ??? Stroke Paternal Uncle         Age 64s     Social History     Social History Narrative    Lives in [place], [place].  [place] from [place].  Originally from [place], [place].  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  Single, no children.  Current smoker of 5-7 cigarettes per day (started age 47).  Occasional alcohol.  Daily cannabis use.  No hard drugs.        PHYSICAL EXAM     Visit Vitals  BP 118/78 (BP 1 Location: Right arm, BP Patient Position: Sitting, BP Cuff Size: Large adult)   Pulse 84   Ht 6' 2.5" (1.892 m)   Wt (!) 353 lb  3.2 oz (160.2 kg)   SpO2 99%   BMI 44.74 kg/m??     GENERAL:  Appears well; no acute distress  LUNGS:  Non-labored breathing; CTA without wheezes, rales, or rhonchi  CARDIAC:  RRR; normal S1/S2; no murmurs; BL LEs without edema  FOOT EXAM:  Normal skin without calluses, ulcers, or lesions.  Dystrophic toenails.  Normal sensation with monofilament testing.  Good perfusion with +2 DP/PT pulses BL.  Vibratory and proprioception testing normal.  PSYCHIATRIC:  Appropriate affect; makes good eye contact; good judgement and insight; normal concentration and attention    ASSESSMENT AND PLAN     1. Controlled type 2 diabetes mellitus without complication, without long-term current use of insulin (HCC)  A1C 7.1% today, close to goal.  Tolerating metformin, would prefer not to start an additional medication if possible.  Discussed dietary modifications including eliminating refined sugar, keeping carbohydrates to 15 g per meal / snack, and avoidance of red meat.  Ordered the below lab work.  Referral to ophthalmology for retinopathy screening.  F/U 3 months.  - MICROALBUMIN, UR, RAND; Future  - METABOLIC PANEL, COMPREHENSIVE; Future  - LIPID PANEL W/ REFLX  DIRECT LDL; Future  - AMB POC HEMOGLOBIN A1C  - REFERRAL TO OPHTHALMOLOGY    2. Primary hypertension  Well-controlled on current regimen, improved from last visit.  No changes made.  Refilled Coreg.  - carvediloL (COREG) 25 mg tablet; Take 1 Tablet by mouth two (2) times daily (with meals).  Dispense: 180 Tablet; Refill: 2    3. Tobacco dependence  Reiterated the dangers of tobacco use, encouraged patient to quit.  He did not reach out to [place] [facility].  He remains interested in NRT.  He would like to start with gum.  RX'd Nicorette.  >3 min was spent on smoking cessation counseling at today's visit.  F/U 3 months.  - nicotine (Nicorette) 2 mg gum; Take 1 Each by mouth as needed Q2H for Smoking Cessation for up to 30 days.  Dispense: 90 Each; Refill: 3    4. Healthcare maintenance  Due for annual wellness.  Ordered the below lab work for prior to the appointment.    - CBC WITH AUTOMATED DIFF; Future  - HEPATITIS C AB W RFLX HCV RNA; Future      Karsten Ro, MD  12/12/2020

## 2020-12-12 NOTE — Telephone Encounter (Signed)
Telephone Encounter by Cindie Laroche at 12/12/20 1513                Author: Cindie Laroche  Service: --  Author Type: --       Filed: 12/12/20 1516  Encounter Date: 12/12/2020  Status: Signed          Editor: Cindie Laroche               RE REFERRAL TO OPHTHALMOLOGY      PCP listed with patient's [facility] insurance needs to be updated to obtain authorization for this referral. Please reach out to patient and have them contact their [facility] and request the PCP listed be updated to Yetta Barre MD. Please advise when complete. Thank you.

## 2020-12-18 NOTE — Telephone Encounter (Signed)
Spoke with [employer].  He agreed to call his insurance and make the change to PCP.  He was asked to call back when this has been taken care of.

## 2020-12-24 NOTE — Telephone Encounter (Signed)
Telephone Encounter by Cindie Laroche at 12/24/20 1135                Author: Cindie Laroche  Service: --  Author Type: --       Filed: 12/24/20 1136  Encounter Date: 12/12/2020  Status: Signed          Editor: Cindie Laroche               Following up on previous note. PCP listed with patient's [facility] is not showing as updated at this time. Please advise. Thank you.

## 2020-12-25 NOTE — Telephone Encounter (Signed)
LVM asking patient to call the office. When [place] calls back please see previous message & update this phone note.

## 2021-01-01 NOTE — Telephone Encounter (Signed)
Marking referral as self pay at this time as patient has not updated PCP listed with insurance. If patient does call to update PCP with insurance please advise and I will attempt to obtain an insurance referral  if needed / possible at that time. Thank you.

## 2021-01-29 NOTE — Telephone Encounter (Signed)
LVM asking for a return call.  When patient calls back please reschedule appt for 04.14.2022 with Dr Yetta Barre      (Provider OOO / LVM asking patient to call and reschedule)

## 2021-03-14 ENCOUNTER — Ambulatory Visit
Attending: Student in an Organized Health Care Education/Training Program | Primary: Student in an Organized Health Care Education/Training Program

## 2021-05-11 ENCOUNTER — Encounter

## 2021-05-13 MED ORDER — METFORMIN 1,000 MG TAB
1000 mg | ORAL_TABLET | ORAL | 0 refills | Status: DC
Start: 2021-05-13 — End: 2021-06-27

## 2021-06-24 ENCOUNTER — Emergency Department
Admit: 2021-06-25 | Payer: PRIVATE HEALTH INSURANCE | Primary: Student in an Organized Health Care Education/Training Program

## 2021-06-24 DIAGNOSIS — M109 Gout, unspecified: Secondary | ICD-10-CM

## 2021-06-24 NOTE — ED Notes (Signed)
Left elbow pain and swelling started today 1700hrs, 8/10 pain reduced ROM unwilling to move due to pain.

## 2021-06-24 NOTE — ED Provider Notes (Signed)
Chief Complaint   Patient presents with    Elbow Pain       Patient presents with left elbow pain.  He noticed this morning when he woke up from sleep he does remember doing anything specifically to it he has not had problems with his elbow before he noticed it has worsened throughout the day when he went to work you started the use it any just could was too painful so he came in for evaluation he thinks it is more swollen than the opposite elbow he denies any high heat or redness it hurts him to move his hand through normal range of motion at the elbow hurts him to move his wrist at the elbow he has no numbness or tingling it is all just pain at specifically at the elbow.  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      Past Medical History:  Past Medical History:   Diagnosis Date    DM2 (diabetes mellitus, type 2) (HCC)     HLD (hyperlipidemia)     HTN (hypertension)     Morbid obesity (HCC)        History reviewed. No pertinent surgical history.    Social History     Socioeconomic History    Marital status: SINGLE     Spouse name: Not on file    Number of children: Not on file    Years of education: Not on file    Highest education level: Not on file   Occupational History    Not on file   Tobacco Use    Smoking status: Every Day     Packs/day: 0.25     Types: Cigarettes    Smokeless tobacco: Never    Tobacco comments:     smokes 6-7 cigarettes per day   Substance and Sexual Activity    Alcohol use: Not Currently    Drug use: Yes     Types: Marijuana    Sexual activity: Not on file   Other Topics Concern    Not on file   Social History Narrative    Lives in [place], [place].  Originally from [place], [place].  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  Single, no children.  Current smoker of 5-7 cigarettes per day (started age 40).  Occasional alcohol.  Daily cannabis use.  No hard drugs.        Social Determinants of Health     Financial Resource Strain: Not on file   Food Insecurity: Not on file   Transportation Needs: Not on file   Physical Activity: Not on file   Stress: Not on file   Social Connections: Not on file   Intimate Partner Violence: Not on file   Housing Stability: Not on file       Family History:  Family History:   Problem Relation Age of Onset    Hypertension Mother     Hypertension Father     Diabetes Father     Stroke Paternal Uncle         Age  60s       Medications:  Patient medication list reviewed  Key Anti-Platelet Anticoagulant Meds       The patient is on no antiplatelet meds or anticoagulants.            Allergies:    Nifedipine    Review of Systems   Constitutional:  Negative for chills and fever.        See HPI   Musculoskeletal:  Positive for joint pain.   Skin:  Negative for rash.   Neurological:  Negative for dizziness, seizures, loss of consciousness and weakness.   Psychiatric/Behavioral:  Negative for depression, substance abuse and suicidal ideas.    All other systems reviewed and are negative.    Vitals:    06/24/21 2145   BP: (!) 165/117   Pulse: 74   Resp: 18   Temp: 98.6 ??F (37 ??C)   SpO2: 99%   Weight: 158.8 kg (350 lb)   Height: 6\' 3"  (1.905 m)     Physical Exam  Vitals and nursing note reviewed.   Constitutional:       General: He is not in acute distress.     Appearance: Normal appearance. He is normal weight. He is not ill-appearing or toxic-appearing.   HENT:      Head: Normocephalic and atraumatic.      Nose: Nose normal.      Mouth/Throat:      Mouth: Mucous membranes are moist.      Pharynx: Oropharynx is clear.   Eyes:      Extraocular Movements: Extraocular movements intact.      Conjunctiva/sclera: Conjunctivae normal.      Pupils:  Pupils are equal, round, and reactive to light.   Cardiovascular:      Rate and Rhythm: Normal rate and regular rhythm.      Pulses: Normal pulses.      Heart sounds: Normal heart sounds.   Pulmonary:      Effort: Pulmonary effort is normal.      Breath sounds: Normal breath sounds.   Abdominal:      General: Abdomen is flat.   Musculoskeletal:         General: Swelling (Left elbow over the olecranon area) and tenderness present. No deformity or signs of injury.      Cervical back: Normal range of motion and neck supple.      Right lower leg: No edema.      Left lower leg: No edema.   Skin:     General: Skin is warm and dry.      Coloration: Skin is not jaundiced.      Findings: No bruising, erythema, lesion or rash.      Comments: The elbow itself is not hot or red   Neurological:      General: No focal deficit present.      Mental Status: He is alert and oriented to person, place, and time. Mental status is at baseline.      Sensory: No sensory deficit.      Motor: No weakness.   Psychiatric:         Mood and Affect: Mood normal.         Behavior: Behavior normal.         Thought Content: Thought content normal.       MDM  Number of Diagnoses or Management Options  Acute gouty arthritis  Left elbow pain  Diagnosis management comments: Patient presents with nontraumatic left elbow  pain there is some mild swelling he has tenderness over the proximal radius as well as the olecranon on there is not appear to be a big joint effusion there is no findings that are consistent with gout the skin is not hot or red he does not have any form of inflammatory arthropathies other than gout.  X-ray done rule out fracture uric acid will be done although this clinically does not appear to be gout he may have simply strained his elbow and his sleep hurt just left a funny position he does have some slight swelling over the olecranon may have a mild bursitis       Amount and/or Complexity of Data Reviewed  Clinical lab tests:  reviewed  Tests in the radiology section of CPT??: reviewed        ED Course:  ED Course as of 06/25/21 0036   Mon Jun 24, 2021   2231 X-rays reviewed by myself did not see any apparent fracture [JB]      ED Course User Index  [JB] Konrad Felix, DO         Procedures    Labs Reviewed   CBC WITH AUTOMATED DIFF - Abnormal; Notable for the following components:       Result Value    WBC 10.9 (*)     HGB 13.9 (*)     MCH 27.5 (*)     RDW 14.2 (*)     ABS. NEUTROPHILS 8.4 (*)     All other components within normal limits   METABOLIC PANEL, COMPREHENSIVE - Abnormal; Notable for the following components:    Glucose 155 (*)     Creatinine 1.93 (*)     GFR est AA 47 (*)     GFR est non-AA 41 (*)     AST (SGOT) 13 (*)     Albumin 3.4 (*)     All other components within normal limits   URIC ACID - Abnormal; Notable for the following components:    Uric acid 9.7 (*)     All other components within normal limits   SED RATE, AUTOMATED - Abnormal; Notable for the following components:    Sed rate, automated 65 (*)     All other components within normal limits   C REACTIVE PROTEIN, QT - Abnormal; Notable for the following components:    C-Reactive protein 1.10 (*)     All other components within normal limits       Imaging Results:  No results found.    Medications given in the ED:  Medications   colchicine tablet 1.2 mg (has no administration in time range)   indomethacin (INDOCIN) capsule 50 mg (has no administration in time range)   colchicine tablet 0.6 mg (has no administration in time range)   ibuprofen (MOTRIN) tablet 600 mg (600 mg Oral Given 06/24/21 2343)       DIAGNOSIS:  1. Acute gouty arthritis    2. Left elbow pain        Current Discharge Medication List        START taking these medications    Details   indomethacin (INDOCIN) 50 mg capsule Take 1 Capsule by mouth three (3) times daily for 10 days. With food  Indications: a type of joint disorder due to excess uric acid in the blood called gout  Qty: 30 Capsule,  Refills: 0  Start date: 06/25/2021, End date: 07/05/2021  Discharged home    FOLLOW-UP:  Karsten Ro, MD  [address]  [place] [address]  [phone]    Schedule an appointment as soon as possible for a visit       [facility]  [address] [place] [address]  [phone]    If symptoms worsen      Please note that portions of this document were created using the M*Modal Fluency Direct dictation system.  Any inconsistencies or typographical errors may be the result of mis-transcription and should be addressed with the document creator.

## 2021-06-25 ENCOUNTER — Inpatient Hospital Stay
Admit: 2021-06-25 | Discharge: 2021-06-25 | Disposition: A | Discharge status: Home / Self Care | Payer: PRIVATE HEALTH INSURANCE | Attending: Family Medicine

## 2021-06-25 LAB — METABOLIC PANEL, COMPREHENSIVE
A-G Ratio: 0.8
ALT (SGPT): 17 U/L (ref 3–35)
AST (SGOT): 13 U/L — ABNORMAL LOW (ref 15–40)
Albumin: 3.4 g/dL — ABNORMAL LOW (ref 3.5–5.0)
Alk. phosphatase: 65 U/L (ref 35–100)
Anion gap: 10 mmol/L
BUN/Creatinine ratio: 9
BUN: 18 MG/DL (ref 7–20)
Bilirubin, total: 0.8 mg/dL (ref 0.10–1.20)
CO2: 26 mmol/L (ref 20–32)
Calcium: 9.1 MG/DL (ref 8.8–10.5)
Chloride: 104 mmol/L (ref 100–110)
Creatinine: 1.93 MG/DL — ABNORMAL HIGH (ref 0.40–1.20)
GFR est AA: 47 mL/min/{1.73_m2} — ABNORMAL LOW (ref >60)
GFR est non-AA: 41 mL/min/{1.73_m2} — ABNORMAL LOW (ref >60)
Globulin: 4.2 g/dL
Glucose: 155 mg/dL — ABNORMAL HIGH (ref 75–110)
Potassium: 4.2 mmol/L (ref 3.5–5.0)
Protein, total: 7.6 g/dL (ref 6.2–8.0)
Sodium: 136 mmol/L (ref 135–145)

## 2021-06-25 LAB — CBC WITH AUTOMATED DIFF
ABS. BASOPHILS: 0 10*3/uL (ref 0.0–0.2)
ABS. EOSINOPHILS: 0.3 10*3/uL (ref 0.0–0.5)
ABS. IMM. GRANS.: 0 10*3/uL (ref 0.0–0.1)
ABS. LYMPHOCYTES: 1.5 10*3/uL (ref 1.0–4.5)
ABS. MONOCYTES: 0.8 10*3/uL (ref 0.1–0.8)
ABS. NEUTROPHILS: 8.4 10*3/uL — ABNORMAL HIGH (ref 1.9–7.8)
ABSOLUTE NRBC: 0 10*3/uL
BASOPHILS: 0 %
EOSINOPHILS: 3 %
HCT: 43.4 % (ref 42.0–52.0)
HGB: 13.9 g/dL — ABNORMAL LOW (ref 14.0–18.0)
IMMATURE GRANULOCYTES: 0 %
LYMPHOCYTES: 14 %
MCH: 27.5 PG — ABNORMAL LOW (ref 28.0–34.0)
MCHC: 32 g/dL (ref 32.0–36.0)
MCV: 85.8 FL (ref 80.0–100.0)
MONOCYTES: 7 %
MPV: 9.7 FL (ref 7.0–12.0)
NEUTROPHILS: 76 %
NRBC: 0 PER 100 WBC
PLATELET: 175 10*3/uL (ref 150–400)
RBC: 5.06 M/uL (ref 4.50–6.00)
RDW: 14.2 % — ABNORMAL HIGH (ref 11.5–13.5)
WBC: 10.9 10*3/uL — ABNORMAL HIGH (ref 4.8–10.8)

## 2021-06-25 LAB — URIC ACID
Uric Acid, Serum: 9.7 MG/DL — ABNORMAL HIGH (ref 3.8–7.6)
Uric acid: 9.7 MG/DL — ABNORMAL HIGH (ref 3.8–7.6)

## 2021-06-25 LAB — SED RATE, AUTOMATED: Sed rate, automated: 65 MM/HR — ABNORMAL HIGH (ref 0–15)

## 2021-06-25 LAB — C REACTIVE PROTEIN, QT: C-Reactive protein: 1.1 mg/dL — ABNORMAL HIGH (ref 0.00–0.90)

## 2021-06-25 LAB — CBC WITH AUTO DIFFERENTIAL
Basophils %: 0 %
Basophils Absolute: 0 10*3/uL (ref 0.0–0.2)
Eosinophils %: 3 %
Eosinophils Absolute: 0.3 10*3/uL (ref 0.0–0.5)
Granulocyte Absolute Count: 0 10*3/uL (ref 0.0–0.1)
Hematocrit: 43.4 % (ref 42.0–52.0)
Hemoglobin: 13.9 g/dL — ABNORMAL LOW (ref 14.0–18.0)
Immature Granulocytes: 0 %
Lymphocytes %: 14 %
Lymphocytes Absolute: 1.5 10*3/uL (ref 1.0–4.5)
MCH: 27.5 PG — ABNORMAL LOW (ref 28.0–34.0)
MCHC: 32 g/dL (ref 32.0–36.0)
MCV: 85.8 FL (ref 80.0–100.0)
MPV: 9.7 FL (ref 7.0–12.0)
Monocytes %: 7 %
Monocytes Absolute: 0.8 10*3/uL (ref 0.1–0.8)
NRBC Absolute: 0 10*3/uL
Neutrophils %: 76 %
Neutrophils Absolute: 8.4 10*3/uL — ABNORMAL HIGH (ref 1.9–7.8)
Nucleated RBCs: 0 PER 100 WBC
Platelets: 175 10*3/uL (ref 150–400)
RBC: 5.06 M/uL (ref 4.50–6.00)
RDW: 14.2 % — ABNORMAL HIGH (ref 11.5–13.5)
WBC: 10.9 10*3/uL — ABNORMAL HIGH (ref 4.8–10.8)

## 2021-06-25 LAB — SEDIMENTATION RATE, AUTOMATED: Sed Rate: 65 MM/HR — ABNORMAL HIGH (ref 0–15)

## 2021-06-25 LAB — COMPREHENSIVE METABOLIC PANEL
ALT: 17 U/L (ref 3–35)
AST: 13 U/L — ABNORMAL LOW (ref 15–40)
Albumin/Globulin Ratio: 0.8
Albumin: 3.4 g/dL — ABNORMAL LOW (ref 3.5–5.0)
Alkaline Phosphatase: 65 U/L (ref 35–100)
Anion Gap: 10 mmol/L
BUN: 18 MG/DL (ref 7–20)
Bun/Cre Ratio: 9 NA
CO2: 26 mmol/L (ref 20–32)
Calcium: 9.1 MG/DL (ref 8.8–10.5)
Chloride: 104 mmol/L (ref 100–110)
Creatinine: 1.93 MG/DL — ABNORMAL HIGH (ref 0.40–1.20)
EGFR IF NonAfrican American: 41 mL/min/{1.73_m2} — ABNORMAL LOW (ref >60)
GFR African American: 47 mL/min/{1.73_m2} — ABNORMAL LOW (ref >60)
Globulin: 4.2 g/dL
Glucose: 155 mg/dL — ABNORMAL HIGH (ref 75–110)
Potassium: 4.2 mmol/L (ref 3.5–5.0)
Sodium: 136 mmol/L (ref 135–145)
Total Bilirubin: 0.8 mg/dL (ref 0.10–1.20)
Total Protein: 7.6 g/dL (ref 6.2–8.0)

## 2021-06-25 LAB — C-REACTIVE PROTEIN: CRP: 1.1 mg/dL — ABNORMAL HIGH (ref 0.00–0.90)

## 2021-06-25 MED ORDER — INDOMETHACIN 25 MG CAP
25 mg | ORAL | Status: AC
Start: 2021-06-25 — End: 2021-06-25
  Administered 2021-06-25: 05:00:00 via ORAL

## 2021-06-25 MED ORDER — COLCHICINE 0.6 MG TAB
0.6 mg | ORAL | Status: AC
Start: 2021-06-25 — End: 2021-06-25

## 2021-06-25 MED ORDER — IBUPROFEN 200 MG TAB
200 mg | ORAL | Status: AC
Start: 2021-06-25 — End: 2021-06-24
  Administered 2021-06-25: 04:00:00 via ORAL

## 2021-06-25 MED ORDER — INDOMETHACIN 50 MG CAP
50 mg | ORAL_CAPSULE | Freq: Three times a day (TID) | ORAL | 0 refills | Status: AC
Start: 2021-06-25 — End: 2021-07-05

## 2021-06-25 MED ORDER — COLCHICINE 0.6 MG TAB
0.6 mg | ORAL | Status: AC
Start: 2021-06-25 — End: 2021-06-25
  Administered 2021-06-25: 05:00:00 via ORAL

## 2021-06-25 MED FILL — COLCHICINE 0.6 MG TAB: 0.6 mg | ORAL | Qty: 1

## 2021-06-25 MED FILL — INDOMETHACIN 25 MG CAP: 25 mg | ORAL | Qty: 2

## 2021-06-25 MED FILL — IBU-200 200 MG TABLET: 200 mg | ORAL | Qty: 1

## 2021-06-25 MED FILL — IBUPROFEN 400 MG TAB: 400 mg | ORAL | Qty: 1

## 2021-06-25 MED FILL — COLCHICINE 0.6 MG TAB: 0.6 mg | ORAL | Qty: 2

## 2021-06-27 ENCOUNTER — Encounter

## 2021-06-27 MED ORDER — METFORMIN 1,000 MG TAB
1000 mg | ORAL_TABLET | Freq: Two times a day (BID) | ORAL | 0 refills | Status: AC
Start: 2021-06-27 — End: ?

## 2021-06-27 MED ORDER — ATORVASTATIN 40 MG TAB
40 mg | ORAL_TABLET | Freq: Every day | ORAL | 3 refills | Status: AC
Start: 2021-06-27 — End: ?

## 2021-06-27 MED ORDER — LISINOPRIL 40 MG TAB
40 mg | ORAL_TABLET | Freq: Every day | ORAL | 3 refills | Status: AC
Start: 2021-06-27 — End: ?

## 2021-06-27 MED ORDER — AMLODIPINE 10 MG TAB
10 mg | ORAL_TABLET | Freq: Every day | ORAL | 3 refills | Status: AC
Start: 2021-06-27 — End: ?

## 2021-06-27 NOTE — Telephone Encounter (Signed)
Telephone Encounter by Pricilla Loveless, CMA at 06/27/21 1109                Author: Pricilla Loveless, CMA  Service: --  Author Type: Medical Assistant       Filed: 06/27/21 1112  Encounter Date: 06/27/2021  Status: Signed          Editor: Pricilla Loveless, CMA (Medical Assistant)

## 2021-06-27 NOTE — Telephone Encounter (Signed)
Noted  

## 2021-06-27 NOTE — Telephone Encounter (Signed)
Per RX protocol, queued Rxs's for provider signature.

## 2021-06-27 NOTE — Telephone Encounter (Signed)
Peter Mueller  October 27, 1981      Call back needed: YES     Preferred call back number: Call preference: Cell phone   [phone] (home)    Telephone Information:   Mobile [phone]        Medications Requested:  Requested Prescriptions     Pending Prescriptions Disp Refills    metFORMIN (GLUCOPHAGE) 1,000 mg tablet 90 Tablet 0     Sig: Take 1 Tablet by mouth two (2) times daily (with meals).    amLODIPine (NORVASC) 10 mg tablet 90 Tablet 3     Sig: Take 1 Tablet by mouth in the morning.    atorvastatin (LIPITOR) 40 mg tablet 90 Tablet 3     Sig: Take 1 Tablet by mouth in the morning.    lisinopriL (PRINIVIL, ZESTRIL) 40 mg tablet 90 Tablet 3     Sig: Take 1 Tablet by mouth in the morning.     Please send to [facility] in [place]. Pt is completely out of lisinopril and metformin. Please call pt when sent [phone]    Preferred Pharmacy:   [facility] [address] [place] - [address]  [address]  [place] [address]  Phone: [phone] Fax: [phone]

## 2021-06-27 NOTE — Telephone Encounter (Signed)
This patient called back in a panic, he is completely out of metformin and lisinopril. He is very upset that this might not be sent. Provider is still in with a patient, I am sending refills to pharmacy.

## 2021-06-27 NOTE — Telephone Encounter (Signed)
Telephone Encounter by Pricilla Loveless, CMA at 06/27/21 1021                Author: Pricilla Loveless, CMA  Service: --  Author Type: Medical Assistant       Filed: 06/27/21 1112  Encounter Date: 06/27/2021  Status: Signed          Editor: Pricilla Loveless, CMA (Medical Assistant)

## 2021-06-27 NOTE — Telephone Encounter (Signed)
Pt has appt with PCP on 8/22 at 1:30 to follow up on elbow gout. Just fyi

## 2021-07-22 ENCOUNTER — Ambulatory Visit
Attending: Student in an Organized Health Care Education/Training Program | Primary: Student in an Organized Health Care Education/Training Program

## 2021-07-22 ENCOUNTER — Ambulatory Visit
Admit: 2021-07-22 | Discharge: 2021-07-22 | Payer: PRIVATE HEALTH INSURANCE | Attending: Student in an Organized Health Care Education/Training Program | Primary: Student in an Organized Health Care Education/Training Program

## 2021-07-22 DIAGNOSIS — M10371 Gout due to renal impairment, right ankle and foot: Secondary | ICD-10-CM

## 2021-07-22 MED ORDER — CLOBETASOL 0.05 % LOTION
0.05 % | CUTANEOUS | 0 refills | Status: DC
Start: 2021-07-22 — End: 2021-07-22

## 2021-07-22 MED ORDER — COLCHICINE 0.6 MG TAB
0.6 mg | ORAL_TABLET | ORAL | 0 refills | Status: DC
Start: 2021-07-22 — End: 2021-07-22

## 2021-07-22 MED ORDER — CARVEDILOL 25 MG TAB
25 mg | ORAL_TABLET | Freq: Two times a day (BID) | ORAL | 2 refills | Status: DC
Start: 2021-07-22 — End: 2021-07-22

## 2021-07-22 MED ORDER — INDOMETHACIN 50 MG CAP
50 mg | ORAL_CAPSULE | Freq: Three times a day (TID) | ORAL | 0 refills | Status: DC
Start: 2021-07-22 — End: 2021-07-22

## 2021-07-22 MED ORDER — CARVEDILOL 25 MG TAB
25 mg | ORAL_TABLET | Freq: Two times a day (BID) | ORAL | 2 refills | Status: AC
Start: 2021-07-22 — End: ?

## 2021-07-22 MED ORDER — COLCHICINE 0.6 MG TAB
0.6 mg | ORAL_TABLET | ORAL | 0 refills | Status: AC
Start: 2021-07-22 — End: ?

## 2021-07-22 MED ORDER — INDOMETHACIN 50 MG CAP
50 mg | ORAL_CAPSULE | Freq: Three times a day (TID) | ORAL | 0 refills | Status: AC
Start: 2021-07-22 — End: 2021-07-27

## 2021-07-22 MED ORDER — CLOBETASOL 0.05 % LOTION
0.05 % | CUTANEOUS | 0 refills | Status: AC
Start: 2021-07-22 — End: ?

## 2021-07-22 NOTE — Progress Notes (Signed)
The patient was counseled on the dangers of tobacco use, and was advised to quit.  Reviewed strategies to maximize success, including written materials.

## 2021-07-22 NOTE — Progress Notes (Signed)
Progress  Notes by Joella Prince, MD at 07/22/21 1330                Author: Joella Prince, MD  Service: --  Author Type: Physician       Filed: 07/22/21 1516  Encounter Date: 07/22/2021  Status: Signed          Editor: Joella Prince, MD (Physician)               [facility] [address]    [place] [address]   [phone]        CHIEF COMPLAINT        Peter Mueller is a 40 y.o. male who presents to clinic for Gout.        HISTORY OF PRESENT ILLNESS        Gout:  Seen at [facility] ER on 06/24/2021 with insidious-onset LT elbow pain when he awoke that morning.  Lab work revealed WBC 10.9 with neutrophil predominance,  ESR 65, CRP 1.1, EGFR 47, uric acid 9.7.  He had an effusion on LT elbow XR.  He was TX'd with colchicine in the ER and RX'd indomethacin.  This caused his SXs to improve.  Today he presents with pain in his RT ankle.  This started on Saturday and by  Sunday it was difficult to walk.  He denies any injury.  He has had gout affect his ankle in the past.  He ate tunafish on Saturday.       CKD 3A:  Based on EGFR in ER.  Does not have known H/O CKD.  Drinks vitamin water, ice tea, and water for hydration.        DM2:  Did not obtain lab work that was ordered at last appointment D/T death in the family (uncle) around that time.       Rash:  Reports a pruritic rash on RT cubital fossa that has also spread to his neck, shoulders, and abdomen.  This started approximately 1 WK ago  in the RT cubital fossa and then spread from there.  He does work out in the garden at the group home.          REVIEW OF SYSTEMS        As per HPI.        PAST MEDICAL & SURGICAL HISTORY          Past Medical History:        Diagnosis  Date         ?  DM2 (diabetes mellitus, type 2) ([place])       ?  HLD (hyperlipidemia)       ?  HTN (hypertension)           ?  Morbid obesity ([place])          History reviewed. No pertinent surgical history.        MEDICATIONS          Current Outpatient  Medications        Medication  Sig         ?  carvediloL (COREG) 25 mg tablet  Take 1 Tablet by mouth two (2) times daily (with meals).     ?  clobetasoL (CLOBEX) 0.05 % lotion  Apply thin layer to affected area twice daily x 5 days.     ?  colchicine 0.6 mg tablet  Take 1.2 mg PO x 1 and then 0.6  mg PO 1h later x 1.     ?  indomethacin (INDOCIN) 50 mg capsule  Take 1 Capsule by mouth three (3) times daily for 5 days.     ?  metFORMIN (GLUCOPHAGE) 1,000 mg tablet  Take 1 Tablet by mouth two (2) times daily (with meals).     ?  amLODIPine (NORVASC) 10 mg tablet  Take 1 Tablet by mouth in the morning.     ?  atorvastatin (LIPITOR) 40 mg tablet  Take 1 Tablet by mouth in the morning.         ?  lisinopriL (PRINIVIL, ZESTRIL) 40 mg tablet  Take 1 Tablet by mouth in the morning.          No current facility-administered medications for this visit.          ALLERGIES          Allergies        Allergen  Reactions         ?  Nifedipine  Unknown (comments)             Allergy listed in HIN- no reaction noted.              FAMILY & SOCIAL HISTORY          Family History         Problem  Relation  Age of Onset          ?  Hypertension  Mother       ?  Hypertension  Father       ?  Diabetes  Father       ?  Stroke  Paternal Uncle                Age 61s          Social History          Social History Narrative          Lives in [place], [place] to [place] from [place].  Originally from [place], [place].  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  Single, no children.  Current smoker of 5-7 cigarettes  per day (started age 64).  Occasional alcohol.  Daily cannabis use.  No hard drugs.             PHYSICAL EXAM        Visit Vitals      BP  136/78     Pulse  96     Ht  '6\' 3"'$  (1.905 m)     Wt  350 lb (158.8 kg)     SpO2  99%        BMI  43.75 kg/m??        GENERAL:  Appears well. No acute distress.   RESPIRATORY:  Non-labored breathing. CTA without wheezes, rales, or rhonchi.   CARDIOVASCULAR:  RRR. Normal S1/S2. No murmurs. BL LEs without edema.   SKIN:   Collection of dry hyperpigmented / erythematous papules on LT cubital fossa.  Similar lesions present on anterior shoulders BL, RT abdomen and RT neck.    MUSCULOSKELETAL:  RT ankle with mild swelling.  No erythema or warmth.  TTP anterior talofibular area.  Antalgic gait.   PSYCHIATRIC:  Appropriate affect. Makes good eye contact. Good judgement and insight. Normal concentration and attention.                ASSESSMENT AND PLAN        1. Acute gout due to  renal impairment involving right ankle   Will repeat TX with colchicine and indomethacin (for shortest effective duration).  He is not on any medication that are known to be strongly associated with gout (however there is some association  with ACEi and BBs). Discussed dietary modifications.  F/U 1 MO for consideration of allopurinol PPX.    - colchicine 0.6 mg tablet; Take 1.2 mg PO x 1 and then 0.6 mg PO 1h later x 1.  Dispense: 3 Tablet; Refill: 0   - indomethacin (INDOCIN) 50 mg capsule; Take 1 Capsule by mouth three (3) times daily for 5 days.  Dispense: 15 Capsule; Refill: 0   - URIC ACID; Future      Gout : Dietary Items to Avoid / Eliminate   ?  Meats (contains lots of purines)-- Berniece Salines, beef, liver, [place], veal, venison   ?  Certain fish--Anchovies, sardines, scallops, mussels   ?  Alcohol--Beer is higher risk compared to wine or hard liquor   ?  Fructose and high fructose corn syrup--Especially sugar-sweetened soft drinks (diet-soda  does not appear to increase risk), fruits and fruit juices      2. Stage 3a chronic kidney disease ([place])   Suspect this is multifactorial (HTN, DM2, gouty nephropathy).  CKD W/U ordered.  F/U 1 MO.    - [place] RETROPERITONEUM COMP; Future   - UA WITH REFLEX MICRO AND CULTURE; Future   - PROTEIN ELECTROPHORESIS W/ REFLX IFE; Future   - PROTEIN ELECTROPHORESIS, URINE RANDOM; Future   - PTH INTACT; Future   - PHOSPHORUS; Future   - CREATININE CLEARANCE; Future   - PROTEIN, URINE, 24 HR; Future      3. Rhus dermatitis   RX'd topical  high-potency steroid.  Also advised washing with Tecnu or strong soap daily.    - clobetasoL (CLOBEX) 0.05 % lotion; Apply thin layer to affected area twice daily x 5 days.  Dispense: 59 mL; Refill: 0      4. Controlled type 2 diabetes mellitus with stage 3 chronic kidney disease, without long-term current use of insulin (HCC)   Ordered updated A1C.    - HEMOGLOBIN A1C WITH EAG; Future      5. Primary hypertension   BP 136/78 today.  Refilled carvedilol.    - carvediloL (COREG) 25 mg tablet; Take 1 Tablet by mouth two (2) times daily (with meals).  Dispense: 180 Tablet; Refill: 2         Joella Prince, MD   07/22/2021

## 2021-07-22 NOTE — Progress Notes (Signed)
Progress  Notes by Karsten Ro, MD at 07/22/21 1330                Author: Karsten Ro, MD  Service: --  Author Type: Physician       Filed: 07/22/21 1516  Encounter Date: 07/22/2021  Status: Signed          Editor: Karsten Ro, MD (Physician)          Scan on 07/22/2021 1402 by Karsten Ro, MD 1

## 2021-08-22 NOTE — Telephone Encounter (Signed)
Please call patient and advise to have over due labs drawn prior to appointment 08/30/21

## 2021-08-22 NOTE — Telephone Encounter (Signed)
Patient will get this done

## 2021-08-30 ENCOUNTER — Ambulatory Visit
Payer: PRIVATE HEALTH INSURANCE | Attending: Student in an Organized Health Care Education/Training Program | Primary: Student in an Organized Health Care Education/Training Program

## 2021-10-25 ENCOUNTER — Inpatient Hospital Stay
Admit: 2021-10-25 | Discharge: 2021-10-26 | Disposition: A | Discharge status: Home / Self Care | Payer: PRIVATE HEALTH INSURANCE | Attending: Physician Assistant

## 2021-10-25 DIAGNOSIS — S39012A Strain of muscle, fascia and tendon of lower back, initial encounter: Secondary | ICD-10-CM

## 2021-10-25 MED ORDER — KETOROLAC TROMETHAMINE 30 MG/ML IJ SOLN
30 MG/ML | Freq: Once | INTRAMUSCULAR | Status: AC
Start: 2021-10-25 — End: 2021-10-25
  Administered 2021-10-25: 30 mg via INTRAMUSCULAR

## 2021-10-25 MED ORDER — CYCLOBENZAPRINE HCL 10 MG PO TABS
10 MG | ORAL_TABLET | Freq: Every evening | ORAL | 0 refills | Status: AC | PRN
Start: 2021-10-25 — End: 2021-11-04

## 2021-10-25 MED ORDER — CYCLOBENZAPRINE HCL 10 MG PO TABS
10 MG | Freq: Once | ORAL | Status: AC
Start: 2021-10-25 — End: 2021-10-25
  Administered 2021-10-25: 10 mg via ORAL

## 2021-10-25 MED ORDER — LIDOCAINE 4 % EX PTCH
4 % | CUTANEOUS | Status: DC
Start: 2021-10-25 — End: 2021-10-25
  Administered 2021-10-25: 1 via TRANSDERMAL

## 2021-10-25 MED FILL — ASPERCREME LIDOCAINE 4 % EX PTCH: 4 % | CUTANEOUS | Qty: 1

## 2021-10-25 MED FILL — KETOROLAC TROMETHAMINE 30 MG/ML IJ SOLN: 30 MG/ML | INTRAMUSCULAR | Qty: 1

## 2021-10-25 MED FILL — CYCLOBENZAPRINE HCL 10 MG PO TABS: 10 MG | ORAL | Qty: 1

## 2021-10-25 NOTE — ED Notes (Signed)
Pt informed RN had ride home prior to medication administration     Janese Banks, RN  10/25/21 1905

## 2021-10-25 NOTE — ED Triage Notes (Signed)
Reports left low back pain shooting down leg, denies hx of the same

## 2021-10-25 NOTE — Discharge Instructions (Signed)
Recommend conservative treatment for low back pain which includes use of rest, ice, alternating ice and heat Tylenol ibuprofen as directed for pain unless otherwise contraindicated   The use of topical Lidoderm patches and or capsaicin cream may also be helpful   Will prescribe patient cyclobenzaprine 10 mg at bedtime daily do not drive after taking this medication as may cause drowsiness  Discussed with patient, recommend contacting your PCP as soon as possible to schedule follow-up appointment   May return to the ER for severe symptoms

## 2021-10-25 NOTE — ED Notes (Signed)
ED provider at pt bedside     Janese Banks, RN  10/25/21 [phone]

## 2021-10-25 NOTE — ED Provider Notes (Signed)
Chief Complaint   Patient presents with    Back Pain       HPI  Patient is a 40 year old male with a history of type 2 diabetes, hypertension morbid obesity presents to the ER reporting low back pain times approximately 4 days   Patient denies any recent fall trauma or other injury   Patient denies any history of IV drug use   Patient denies any saddle anesthesia, urinary incontinence, or difficulty with ambulation or leg weakness     Medical History:  Current Problem List:   There is no problem list on file for this patient.      Past Medical History:  Past Medical History:   Diagnosis Date    DM2 (diabetes mellitus, type 2) (HCC)     HLD (hyperlipidemia)     HTN (hypertension)     Morbid obesity (HCC)        History reviewed. No pertinent surgical history.    Social History     Socioeconomic History    Marital status: Single     Spouse name: Not on file    Number of children: Not on file    Years of education: Not on file    Highest education level: Not on file   Occupational History    Not on file   Tobacco Use    Smoking status: Every Day     Packs/day: 0.25     Types: Cigarettes    Smokeless tobacco: Never   Substance and Sexual Activity    Alcohol use: Not Currently    Drug use: Yes     Types: Marijuana Sheran Fava)    Sexual activity: Not on file   Other Topics Concern    Not on file   Social History Narrative    Lives in [place], [place].  [place] to [place] from [place].  Originally from [place], [place].  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  Single, no children.  Current smoker of 5-7 cigarettes per day (started age 35).  Occasional alcohol.  Daily cannabis use.  No hard drugs.        Social Determinants of Health     Financial Resource Strain: Not on file   Food Insecurity: Not on file   Transportation Needs: Not on file   Physical Activity: Not on file   Stress: Not on file   Social Connections: Not on file   Intimate Partner Violence: Not on file   Housing Stability: Not on file       Family History:  Family History   Problem Relation  Age of Onset    Hypertension Mother     Hypertension Father     Diabetes Father     Stroke Paternal Uncle         Age 64s       Medications:  Patient medication list reviewed  Previous Medications    AMLODIPINE (NORVASC) 10 MG TABLET    Take 10 mg by mouth daily    ATORVASTATIN (LIPITOR) 40 MG TABLET    Take 40 mg by mouth daily    CARVEDILOL (COREG) 25 MG TABLET    Take 25 mg by mouth 2 times daily (with meals)    CLOBETASOL PROPIONATE 0.05 % LOTN LOTION    Apply thin layer to affected area twice daily x 5 days.    COLCHICINE (COLCRYS) 0.6 MG TABLET    Take 1.2 mg PO x 1 and then 0.6 mg PO 1h later x 1.  LISINOPRIL (PRINIVIL;ZESTRIL) 40 MG TABLET    Take 40 mg by mouth daily    METFORMIN (GLUCOPHAGE) 1000 MG TABLET    Take 1,000 mg by mouth 2 times daily (with meals)       Anticoagulants / Antiplatelet medications:  This patient does not have an active medication from one of the medication groupers.    Allergies:    Nifedipine    Review of Systems  Associated symptoms pertinent negatives as described in HPI.  Ten point review of systems was also negative  ED Triage Vitals [10/25/21 1625]   BP Temp Temp Source Heart Rate Resp SpO2 Height Weight   117/81 98.1 ??F (36.7 ??C) Oral 95 16 98 % 6\' 2"  (1.88 m) (!) 360 lb (163.3 kg)     Physical Exam  Vitals and nursing note reviewed.   Constitutional:       Appearance: Normal appearance. He is obese.   Eyes:      Pupils: Pupils are equal, round, and reactive to light.   Cardiovascular:      Rate and Rhythm: Normal rate.      Pulses: Normal pulses.   Pulmonary:      Effort: Pulmonary effort is normal.   Abdominal:      Tenderness: There is no abdominal tenderness. There is no guarding.   Musculoskeletal:         General: Tenderness present. No swelling, deformity or signs of injury. Normal range of motion.      Right lower leg: No edema.      Left lower leg: No edema.      Comments: Inspection of patient's PACs shows lumbar paraspinal muscle spasm with mild tenderness on  palpation  There is no step-offs deformity crepitus of the entire spinal column   Skin:     General: Skin is warm and dry.      Capillary Refill: Capillary refill takes less than 2 seconds.      Coloration: Skin is not pale.      Findings: No erythema or rash.   Neurological:      General: No focal deficit present.      Mental Status: He is alert and oriented to person, place, and time.   Psychiatric:         Mood and Affect: Mood normal.         Behavior: Behavior normal.       Medical Decision Making:  Patient presents to the ED as stated above  Low back pain x4 days   No reported trauma, will defer x-rays   Patient denies any red flag symptoms   Patient's vital signs and physical exam are reassuring  Will give patient Lidoderm patch, ketorolac, cyclobenzaprine in the ED   Discussed with patient will provide him with prescription for cyclobenzaprine for use at night  Discussed conservative treatment, rice, alternating ice heat Tylenol ibuprofen capsaicin cream Lidoderm patches   Recommend close follow-up with PCP   May return to the ER for severe symptoms    ED Course:       Procedures    Vital Signs for this visit:  Vitals:    10/25/21 1625   BP: 117/81   Pulse: 95   Resp: 16   Temp: 98.1 ??F (36.7 ??C)   TempSrc: Oral   SpO2: 98%   Weight: (!) 360 lb (163.3 kg)   Height: 6\' 2"  (1.88 m)       Lab findings during this visit (only abnormal values  will be noted, if no value noted then the result was normal range):  Labs Reviewed - No data to display    Radiology studies during this visit and the past 24 hours:  No results found.    Medications given in the ED:  Medications   lidocaine 4 % external patch 1 patch (1 patch TransDERmal Patch Applied 10/25/21 1848)   ketorolac (TORADOL) injection 30 mg (30 mg IntraMUSCular Given 10/25/21 1848)   cyclobenzaprine (FLEXERIL) tablet 10 mg (10 mg Oral Given 10/25/21 1848)       Diagnosis:  1. Strain of lumbar region, initial encounter            Condition at  disposition:  stable    DISPOSITION Decision To Discharge 10/25/2021 06:54:27 PM      Discharge prescriptions and/or changes if applicable:       Medication List        START taking these medications      cyclobenzaprine 10 MG tablet  Commonly known as: FLEXERIL  Take 1 tablet by mouth nightly as needed for Muscle spasms            ASK your doctor about these medications      amLODIPine 10 MG tablet  Commonly known as: NORVASC     atorvastatin 40 MG tablet  Commonly known as: LIPITOR     carvedilol 25 MG tablet  Commonly known as: COREG     clobetasol propionate 0.05 % Lotn lotion     colchicine 0.6 MG tablet  Commonly known as: COLCRYS     lisinopril 40 MG tablet  Commonly known as: PRINIVIL;ZESTRIL     metFORMIN 1000 MG tablet  Commonly known as: GLUCOPHAGE               Where to Get Your Medications        These medications were sent to [facility] [address] [place] - [address] - [place] [phone] Carmon Ginsberg [phone]  [address] [place] [address]      Phone: [phone]   cyclobenzaprine 10 MG tablet         Follow-up if applicable:  Karsten Ro, MD  [address]  [place] [address]  [phone]    Schedule an appointment as soon as possible for a visit       [facility] EMERGENCY DEPARTMENT  [address] [place] [address]  [phone]    If symptoms worsen    Please note that portions of this document were created using the M*Modal Fluency Direct dictation system.  Any inconsistencies or typographical errors may be the result of mis-transcription that persist in spite of proof-reading and should be addressed with the document creator.       Cristie Hem, PA  10/25/21 1857

## 2021-10-25 NOTE — ED Notes (Signed)
Registration at pt bedside     Janese Banks, RN  10/25/21 1926

## 2021-11-29 MED ORDER — ATORVASTATIN CALCIUM 40 MG PO TABS
40 MG | ORAL_TABLET | Freq: Every day | ORAL | 0 refills | Status: DC
Start: 2021-11-29 — End: 2021-12-12

## 2021-11-29 MED ORDER — METFORMIN HCL 1000 MG PO TABS
1000 MG | ORAL_TABLET | Freq: Two times a day (BID) | ORAL | 0 refills | Status: DC
Start: 2021-11-29 — End: 2021-12-12

## 2021-11-29 MED ORDER — LISINOPRIL 40 MG PO TABS
40 MG | ORAL_TABLET | Freq: Every day | ORAL | 0 refills | Status: AC
Start: 2021-11-29 — End: 2021-12-12

## 2021-11-29 MED ORDER — AMLODIPINE BESYLATE 10 MG PO TABS
10 MG | ORAL_TABLET | Freq: Every day | ORAL | 0 refills | Status: DC
Start: 2021-11-29 — End: 2021-12-12

## 2021-11-29 NOTE — Telephone Encounter (Signed)
Amlodipine pended due to allergy contradictions.Atorvastatin pended due to drug to drug contradictions  All others sent to pharmacy     Medications Requested:  Requested Prescriptions     Pending Prescriptions Disp Refills    amLODIPine (NORVASC) 10 MG tablet 30 tablet      Sig: Take 1 tablet by mouth daily    metFORMIN (GLUCOPHAGE) 1000 MG tablet 60 tablet      Sig: Take 1 tablet by mouth 2 times daily (with meals)    lisinopril (PRINIVIL;ZESTRIL) 40 MG tablet 30 tablet      Sig: Take 1 tablet by mouth daily    atorvastatin (LIPITOR) 40 MG tablet 30 tablet      Sig: Take 1 tablet by mouth daily       Preferred Pharmacy:   [facility] [address], [place] - [address] - [place] [phone] Carmon Ginsberg [phone]  [address]  [place] [address]  Phone: [phone] Fax: [phone]          Prescription Refill Protocol reviewed: Yes    Allergy List Reviewed and Verified: Yes    Possible medication to medication interactions reviewed: Yes    Last appt @ PCP Office: 07/22/2021     Future Appointments   Date Time Provider Department Center   12/12/2021  1:00 PM Max Shiela Mayer, MD [facility]       MOST RECENT BLOOD PRESSURES  BP Readings from Last 3 Encounters:   10/25/21 (!) 143/87   07/22/21 136/78   12/12/20 118/78         MOST RECENT LAB DATA  Lab Results   Component Value Date/Time    K 4.2 06/24/2021 10:47 PM    ALT 17 06/24/2021 10:47 PM    HGB 13.9 06/24/2021 10:47 PM    HCT 43.4 06/24/2021 10:47 PM    HBA1CPOC 7.1 12/12/2020 09:54 AM

## 2021-11-29 NOTE — Telephone Encounter (Signed)
Peter Mueller  [birth year]      Call back needed: YES    Preferred call back number: Cell phone  [phone] (home)    Telephone Information:   Mobile [phone]        Medications Requested:  Requested Prescriptions     Pending Prescriptions Disp Refills    amLODIPine (NORVASC) 10 MG tablet 30 tablet      Sig: Take 1 tablet by mouth daily    metFORMIN (GLUCOPHAGE) 1000 MG tablet 60 tablet      Sig: Take 1 tablet by mouth 2 times daily (with meals)    lisinopril (PRINIVIL;ZESTRIL) 40 MG tablet 30 tablet      Sig: Take 1 tablet by mouth daily    atorvastatin (LIPITOR) 40 MG tablet 30 tablet      Sig: Take 1 tablet by mouth daily     Pt is completely out of Metformin. Pt would like a call when sent please [phone]    Preferred Pharmacy:   [facility] [address], [place] - [address] - [place] [phone] Carmon Ginsberg [phone]  [address]  [place] [address]  Phone: [phone] Fax: [phone]

## 2021-12-10 ENCOUNTER — Encounter

## 2021-12-12 ENCOUNTER — Ambulatory Visit
Admit: 2021-12-12 | Discharge: 2021-12-12 | Payer: PRIVATE HEALTH INSURANCE | Attending: Student in an Organized Health Care Education/Training Program | Primary: Student in an Organized Health Care Education/Training Program

## 2021-12-12 DIAGNOSIS — M5442 Lumbago with sciatica, left side: Secondary | ICD-10-CM

## 2021-12-12 MED ORDER — AMLODIPINE BESYLATE 10 MG PO TABS
10 MG | ORAL_TABLET | Freq: Every day | ORAL | 3 refills | Status: AC
Start: 2021-12-12 — End: 2022-12-19

## 2021-12-12 MED ORDER — METFORMIN HCL 1000 MG PO TABS
1000 MG | ORAL_TABLET | Freq: Two times a day (BID) | ORAL | 3 refills | Status: AC
Start: 2021-12-12 — End: 2023-05-28

## 2021-12-12 MED ORDER — CARVEDILOL 25 MG PO TABS
25 MG | ORAL_TABLET | Freq: Two times a day (BID) | ORAL | 3 refills | Status: AC
Start: 2021-12-12 — End: 2022-07-31

## 2021-12-12 MED ORDER — ATORVASTATIN CALCIUM 40 MG PO TABS
40 MG | ORAL_TABLET | Freq: Every day | ORAL | 3 refills | Status: DC
Start: 2021-12-12 — End: 2022-02-28

## 2021-12-12 MED ORDER — LISINOPRIL 40 MG PO TABS
40 MG | ORAL_TABLET | Freq: Every day | ORAL | 3 refills | Status: DC
Start: 2021-12-12 — End: 2022-02-28

## 2021-12-12 NOTE — Progress Notes (Signed)
[facility] [place] [address]    CHIEF COMPLAINT     Peter Mueller is a 41 y.o. male who presents to clinic for F/U ER Visit.     HISTORY OF PRESENT ILLNESS     Acute low back pain:  Reports he was layed up in bed x 4 days in 10/2021 with low back pain.  Did not have any preceding injury or trauma.  Pain radiated to the LT glute.  He presented to [facility] ER on 10/25/2021 and was given a Lidoderm patch, Toradol, and cyclobenzaprine.  No imaging obtained.  A few days later he felt fine.  Denies red flag SXs of fever, unexplained WL, progressive or focal neurologic deficits including weakness or sensory loss in LEs, saddle anesthesia, bowel or bladder dysfunction.    REVIEW OF SYSTEMS     As per HPI.    PAST MEDICAL & SURGICAL HISTORY     Past Medical History:   Diagnosis Date    DM2 (diabetes mellitus, type 2) ([place])     HLD (hyperlipidemia)     HTN (hypertension)     Morbid obesity ([place])      History reviewed. No pertinent surgical history.    MEDICATIONS     Current Outpatient Medications   Medication Sig    amLODIPine (NORVASC) 10 MG tablet Take 1 tablet by mouth daily    metFORMIN (GLUCOPHAGE) 1000 MG tablet Take 1 tablet by mouth 2 times daily (with meals)    lisinopril (PRINIVIL;ZESTRIL) 40 MG tablet Take 1 tablet by mouth daily    atorvastatin (LIPITOR) 40 MG tablet Take 1 tablet by mouth daily    carvedilol (COREG) 25 MG tablet Take 1 tablet by mouth 2 times daily (with meals)    clobetasol propionate 0.05 % LOTN lotion Apply thin layer to affected area twice daily x 5 days. (Patient not taking: Reported on 12/12/2021)     No current facility-administered medications for this visit.     ALLERGIES     Allergies   Allergen Reactions    Nifedipine      Other reaction(s): Unknown (comments)  Allergy listed in HIN- no reaction noted.      FAMILY & SOCIAL HISTORY     Family History   Problem Relation Age of Onset    Hypertension Mother     Hypertension Father      Diabetes Father     Stroke Paternal Uncle         Age 24s     Social History     Social History Narrative    Lives in [place], [place] to [place] from [place].  Originally from [place], [place].  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  Single, no children.  Current smoker of 5-7 cigarettes per day (started age 62).  Occasional alcohol.  Daily cannabis use.  No hard drugs.        PHYSICAL EXAM     BP (!) 132/92    Pulse 87    Ht 6' 2.02" (1.88 m)    Wt (!) 371 lb (168.3 kg)    SpO2 97%    BMI 47.61 kg/m??     GENERAL:  Appears well. No acute distress.  RESPIRATORY:  Non-labored breathing.   CARDIOVASCULAR:  Well-perfused.   MUSCULOSKELETAL:  No vertebral or paraspinal TTP.  No SI joint TTP.  SLR (-) BL.   NEUROLOGIC:  Strength and sensation intact in LEs BL. Normal  DTRs.   PSYCHIATRIC:  Appropriate affect. Makes good eye contact. Good judgement and insight. Normal concentration and attention.    ASSESSMENT AND PLAN     1. Acute left-sided low back pain with left-sided sciatica  Resolved.  DDX includes herniated disc versus muscle spasm.      2. Type 2 diabetes mellitus without complication, without long-term current use of insulin ([place])  Due for updated lab work, which was ordered.    - Comprehensive Metabolic Panel; Future  - Hemoglobin A1C; Future  - Lipid Panel; Future  - Microalbumin / Creatinine Urine Ratio; Future    3. Chronic gout with tophus, unspecified cause, unspecified site  Last uric acid level >9, during last gout flare.  Will obtain repeat.   - Uric Acid; Future    4. Elevated erythrocyte sedimentation rate  Last ESR 65, during last gout flare.  Repeat obtained to ensure resolution.   - Sedimentation Rate; Future  - C-Reactive Protein; Future    5. Healthcare maintenance  Due for annual PE.  HCV screening ordered for prior to the appointment.   - Hep C AB RLFX HCV PCR-A; Future    A total of 30 min was spent with this patient today, including patient interaction, chart review, and note writing.      Joella Prince,  MD  12/12/2021

## 2022-01-31 ENCOUNTER — Encounter
Payer: PRIVATE HEALTH INSURANCE | Attending: Student in an Organized Health Care Education/Training Program | Primary: Student in an Organized Health Care Education/Training Program

## 2022-02-27 NOTE — Telephone Encounter (Signed)
Medications Requested:  Requested Prescriptions     Pending Prescriptions Disp Refills    lisinopril (PRINIVIL;ZESTRIL) 40 MG tablet [Pharmacy Med Name: Lisinopril 40 MG Oral Tablet] 90 tablet 0     Sig: Take 1 tablet by mouth once daily    atorvastatin (LIPITOR) 40 MG tablet [Pharmacy Med Name: Atorvastatin Calcium 40 MG Oral Tablet] 90 tablet 0     Sig: Take 1 tablet by mouth once daily       Fails protocol- Pended for PCP to review and sign if appropriate.                   Preferred Pharmacy:   [facility] [address] [place] - [address] - [place] [phone] Carmon Ginsberg [phone]  [address]  [place] [address]  Phone: [phone] Fax: [phone]      Date of Last Refill: 12/24/2021     Prescription Refill Protocol reviewed: Yes    Allergy List Reviewed and Verified: Yes    Possible medication to medication interactions reviewed: Yes    Last appt @ PCP Office: 12/12/2021     Future Appointments   Date Time Provider Department Center   04/24/2022  8:30 AM Max Shiela Mayer, MD [facility] AMB   04/30/2022  8:30 AM Max Shiela Mayer, MD [facility]       MOST RECENT BLOOD PRESSURES  BP Readings from Last 3 Encounters:   12/12/21 (!) 132/92   10/25/21 (!) 143/87   07/22/21 136/78         MOST RECENT LAB DATA  Lab Results   Component Value Date/Time    K 4.2 06/24/2021 10:47 PM    ALT 17 06/24/2021 10:47 PM    HGB 13.9 06/24/2021 10:47 PM    HCT 43.4 06/24/2021 10:47 PM    HBA1CPOC 7.1 12/12/2020 09:54 AM       Andres Shad, MA  02/27/22  8:28 AM

## 2022-03-01 MED ORDER — ATORVASTATIN CALCIUM 40 MG PO TABS
40 MG | ORAL_TABLET | Freq: Every evening | ORAL | 3 refills | Status: AC
Start: 2022-03-01 — End: 2022-07-31

## 2022-03-01 MED ORDER — LISINOPRIL 40 MG PO TABS
40 MG | ORAL_TABLET | Freq: Every day | ORAL | 3 refills | Status: DC
Start: 2022-03-01 — End: 2023-03-11

## 2022-04-17 ENCOUNTER — Ambulatory Visit: Payer: PRIVATE HEALTH INSURANCE | Primary: Student in an Organized Health Care Education/Training Program

## 2022-04-23 NOTE — Telephone Encounter (Signed)
Attempt #1. PSR attempted to contact Maico to make him aware provider is out to the office tomorrow 04/24/22. Generic message left on voicemail. If/when patient returns call please assist patient in re-scheduling appointment.      Shawntez can be reached at [phone]

## 2022-04-23 NOTE — Telephone Encounter (Signed)
Noted, thanks.

## 2022-04-23 NOTE — Telephone Encounter (Signed)
Pt appt has been rescheduled to:    8/31 @ 9am,  for Annual PE.    [phone]

## 2022-04-24 ENCOUNTER — Ambulatory Visit
Payer: PRIVATE HEALTH INSURANCE | Attending: Student in an Organized Health Care Education/Training Program | Primary: Student in an Organized Health Care Education/Training Program

## 2022-04-25 ENCOUNTER — Inpatient Hospital Stay
Admit: 2022-04-25 | Payer: PRIVATE HEALTH INSURANCE | Primary: Student in an Organized Health Care Education/Training Program

## 2022-04-25 DIAGNOSIS — N1831 Chronic kidney disease, stage 3a: Secondary | ICD-10-CM

## 2022-04-30 ENCOUNTER — Ambulatory Visit
Admit: 2022-04-30 | Discharge: 2022-04-30 | Payer: PRIVATE HEALTH INSURANCE | Attending: Student in an Organized Health Care Education/Training Program | Primary: Student in an Organized Health Care Education/Training Program

## 2022-04-30 ENCOUNTER — Inpatient Hospital Stay
Admit: 2022-04-30 | Payer: PRIVATE HEALTH INSURANCE | Primary: Student in an Organized Health Care Education/Training Program

## 2022-04-30 DIAGNOSIS — N1832 Chronic kidney disease, stage 3b: Secondary | ICD-10-CM

## 2022-04-30 DIAGNOSIS — E119 Type 2 diabetes mellitus without complications: Secondary | ICD-10-CM

## 2022-04-30 DIAGNOSIS — E1122 Type 2 diabetes mellitus with diabetic chronic kidney disease: Secondary | ICD-10-CM

## 2022-04-30 LAB — COMPREHENSIVE METABOLIC PANEL
ALT: 16 U/L (ref 3–35)
AST: 15 U/L (ref 15–40)
Albumin/Globulin Ratio: 0.9
Albumin: 3.4 g/dL — ABNORMAL LOW (ref 3.5–5.0)
Alk Phosphatase: 66 U/L (ref 35–100)
Anion Gap: 14 mmol/L
BUN: 21 MG/DL — ABNORMAL HIGH (ref 7–20)
Bun/Cre Ratio: 10
CO2: 25 mmol/L (ref 20–32)
Calcium: 9.7 MG/DL (ref 8.8–10.5)
Chloride: 103 mmol/L (ref 100–110)
Creatinine: 2.16 MG/DL — ABNORMAL HIGH (ref 0.40–1.20)
Est, Glom Filt Rate: 38 mL/min/{1.73_m2}
Globulin: 3.7 g/dL
Glucose: 183 mg/dL — ABNORMAL HIGH (ref 75–110)
Potassium: 4.6 mmol/L (ref 3.5–5.0)
Sodium: 137 mmol/L (ref 135–145)
Total Bilirubin: 0.6 mg/dL (ref 0.10–1.20)
Total Protein: 7.1 g/dL (ref 6.2–8.0)

## 2022-04-30 LAB — MICROALBUMIN / CREATININE URINE RATIO
Creatinine, Ur: 222 mg/dL
Microalbumin Creatinine Ratio: 502 mg/g — ABNORMAL HIGH (ref 0–30)
Microalbumin, Random Urine: 1114 mg/L — ABNORMAL HIGH (ref 0.00–30.00)

## 2022-04-30 LAB — AMB POC HEMOGLOBIN A1C: Hemoglobin A1C, POC: 6.9 %

## 2022-04-30 LAB — URINALYSIS WITH REFLEX TO CULTURE
Bilirubin Urine: NEGATIVE
Blood, Urine: NEGATIVE
Glucose, Ur: NEGATIVE mg/dL
Ketones, Urine: NEGATIVE mg/dL
Leukocyte Esterase, Urine: NEGATIVE
Nitrite, Urine: NEGATIVE
Protein, Urine: 100 mg/dL — AB
Specific Gravity, UA: 1.025 (ref 1.005–1.030)
Urobilinogen, Urine: 0.2 EU/dL (ref 0.1–1.0)
pH, Urine: 5.5 (ref 5.0–9.0)

## 2022-04-30 LAB — SEDIMENTATION RATE: Sed Rate: 46 MM/HR — ABNORMAL HIGH (ref 0–15)

## 2022-04-30 LAB — URINE MICROSCOPIC WITH REFLEX TO CULTURE
RBC, UA: NONE SEEN /hpf (ref <3)
WBC, UA: NONE SEEN /hpf (ref <6)

## 2022-04-30 LAB — LIPID PANEL
Cholesterol: 157 MG/DL (ref 115–200)
HDL: 30 MG/DL — ABNORMAL LOW (ref 40–60)
LDL Cholesterol: 95 MG/DL (ref 65–130)
Non-HDL Cholesterol: 127 mg/dL
Triglycerides: 160 MG/DL — ABNORMAL HIGH (ref 30–150)

## 2022-04-30 LAB — URIC ACID: Uric Acid: 10 MG/DL — ABNORMAL HIGH (ref 3.8–7.6)

## 2022-04-30 LAB — C-REACTIVE PROTEIN: CRP: 0.9 mg/dL (ref 0.00–0.90)

## 2022-04-30 LAB — PTH, INTACT: Pth Intact: 77.2 pg/mL (ref 12.0–88.0)

## 2022-04-30 LAB — PHOSPHORUS: Phosphorus: 4 MG/DL (ref 2.4–4.7)

## 2022-04-30 NOTE — Progress Notes (Unsigned)
[facility] [address]   [address] [place] [address]    CHIEF COMPLAINT     Peter Mueller is a 41 y.o. male who presents to clinic for Follow-up Chronic Condition    HISTORY OF PRESENT ILLNESS         REVIEW OF SYSTEMS     As per HPI.    PAST MEDICAL & SURGICAL HISTORY     Past Medical History:   Diagnosis Date    DM2 (diabetes mellitus, type 2) (HCC)     HLD (hyperlipidemia)     HTN (hypertension)     Morbid obesity (HCC)      History reviewed. No pertinent surgical history.    MEDICATIONS     Current Outpatient Medications   Medication Sig    lisinopril (PRINIVIL;ZESTRIL) 40 MG tablet Take 1 tablet by mouth daily    atorvastatin (LIPITOR) 40 MG tablet Take 1 tablet by mouth nightly    amLODIPine (NORVASC) 10 MG tablet Take 1 tablet by mouth daily    metFORMIN (GLUCOPHAGE) 1000 MG tablet Take 1 tablet by mouth 2 times daily (with meals)    carvedilol (COREG) 25 MG tablet Take 1 tablet by mouth 2 times daily (with meals)    clobetasol propionate 0.05 % LOTN lotion Apply thin layer to affected area twice daily x 5 days. (Patient not taking: Reported on 12/12/2021)     No current facility-administered medications for this visit.     ALLERGIES     Allergies   Allergen Reactions    Nifedipine      Other reaction(s): Unknown (comments)  Allergy listed in HIN- no reaction noted.      FAMILY & SOCIAL HISTORY     Family History   Problem Relation Age of Onset    Hypertension Mother     Hypertension Father     Diabetes Father     Stroke Paternal Uncle         Age 37s     Social History     Social History Narrative    Lives in [place], [place].  [place] from [place].  Originally from [place], [place].  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  Single, no children.  Current smoker of 5-7 cigarettes per day (started age 48).  Occasional alcohol.  Daily cannabis use.  No hard drugs.        PHYSICAL EXAM     BP (!) 140/92   Pulse 82   Ht 6' 2.02" (1.88 m)   Wt (!) 369 lb 6.4 oz (167.6 kg)   SpO2 97%   BMI 47.41 kg/m      GENERAL:  Appears well. No acute distress.   EYES:  PERRL. EOMI. Conjunctiva and sclera clear.  EARS/NOSE/THROAT:  External auditory canals clear with grossly normal hearing. TMs clear BL. Oropharynx without lesions. MMM. Good dentition.  NECK:  Supple. No masses, thyromegaly, or adenopathy.  RESPIRATORY:  Non-labored breathing. Lungs CTA without wheezes, rales, or rhonchi.  CARDIOVASCULAR:  Well perfused. RRR. Normal S1/S2. No murmurs. BL LEs without edema.  ABDOMEN:  Soft. Non-tender. Non-distended.   SKIN:  Limited exam - no rashes or lesions.  MUSCULOSKELETAL:  Normal muscle tone, bulk, and strength.   NEUROLOGIC:  No FNDs. Strength and sensation grossly intact in all 4 extremities.  PSYCHIATRIC:  Appropriate affect. Makes good eye contact. Good judgment and insight. Normal concentration and attention.    ASSESSMENT AND PLAN     1. Type 2  diabetes mellitus with diabetic chronic kidney disease, unspecified CKD stage, unspecified whether long term insulin use (HCC)  ***  - AMB POC HEMOGLOBIN A1C      Joella Prince, MD  04/30/2022

## 2022-04-30 NOTE — Progress Notes (Unsigned)
Diabetic foot exam:   Left Foot:   Visual Exam: callous- and very dry   Pulse DP: 1+ (weak)   Filament test: 6/6     Right Foot:   Visual Exam: callous- and very dry   Pulse DP: 1+ (weak)   Filament test: 6/6

## 2022-05-02 LAB — HEP C AB RFLX HCV PCR-A: Hepatitis C Ab: NEGATIVE

## 2022-05-02 LAB — ELECTROPHORESIS PROTEIN, SERUM
Albumin/Globulin Ratio: 0.8
Albumin: 3.1 G/DL — ABNORMAL LOW (ref 3.4–4.7)
AlpHa-2 Globulin: 1.2 g/dL — ABNORMAL HIGH (ref 0.6–1.0)
Alpha-1-Globulin: 0.3 g/dL (ref 0.1–0.3)
Beta-Globulin: 0.9 g/dL (ref 0.7–1.2)
Gamma-Globulin: 1.6 g/dL (ref 0.6–1.6)
Total Protein: 7 G/DL (ref 6.3–7.9)

## 2022-05-03 ENCOUNTER — Inpatient Hospital Stay
Admit: 2022-05-03 | Payer: PRIVATE HEALTH INSURANCE | Primary: Student in an Organized Health Care Education/Training Program

## 2022-05-03 DIAGNOSIS — N1832 Chronic kidney disease, stage 3b: Secondary | ICD-10-CM

## 2022-05-03 LAB — CREATININE CLEARANCE
Collection Duration: 24 hr
Creatinine Clearance (Corr.): 49.1 mL/min — ABNORMAL LOW (ref 75.0–125.0)
Creatinine clr.,uncorr.: 79.9 mL/min (ref 75.0–125.0)
Creatinine, Ur: 108.7 mg/dL
Creatinine: 1.87 MG/DL — ABNORMAL HIGH (ref 0.40–1.20)
Volume: 1980 mL

## 2022-05-04 MED ORDER — EMPAGLIFLOZIN 10 MG PO TABS
10 MG | ORAL_TABLET | Freq: Every day | ORAL | 0 refills | Status: DC
Start: 2022-05-04 — End: 2022-07-31

## 2022-05-04 MED ORDER — ALLOPURINOL 100 MG PO TABS
100 MG | ORAL_TABLET | Freq: Every day | ORAL | 1 refills | Status: DC
Start: 2022-05-04 — End: 2022-09-24

## 2022-05-05 LAB — PROTEIN ELECTROPHORESIS, URINE
A/G Ratio: 2.7 %
Albumin, U: 116.1 mg/dL
Alpha-1-Globulin, U: 3.2 mg/dL
Alpha-2-Globulin, U: 8 mg/dL
Beta Globulin, U: 12.7 mg/dL
Creatinine: 212 mg/dL (ref 16–326)
Gamma Globulin, U: 19.1 mg/dL
PROTEIN/CREAT RATIO: 0.75 mg/mg — ABNORMAL HIGH (ref <0.18)
Urine Total Protein: 159 mg/dL

## 2022-05-08 LAB — PROTEIN ELECT. URINE 24 HR, W RFLX IMMUNOFIX.
A/G Ratio: 2.13
Albumin: 1130.8 mg/24 h
Alpha-1-Globulin: 83.2 mg/24 h
Beta Globulin: 149.7 mg/24 h
Collection Duration: 24 h
Gamma Globulin: 232.8 mg/24 h
Total Protein: 1663 mg/24 h — ABNORMAL HIGH (ref <229)
Urine Volume: 1980 mL

## 2022-07-24 NOTE — Progress Notes (Signed)
Last PE= None found    PREVENTIVE CARE - SCREENINGS  Colon cancer:  Not indicated Patient age 41   Lung cancer: Not indicated Patient age 16   Smoking Hx  reports that he has been smoking cigarettes. He has been smoking an average of .25 packs per day. He has never used smokeless tobacco.   Hepatitis C: Up to date Done 04/30/22-Negative   Lipids:  Up to date   Lab Results   Component Value Date    CHOL 157 04/30/2022     Lab Results   Component Value Date    TRIG 160 (H) 04/30/2022     Lab Results   Component Value Date    HDL 30 (L) 04/30/2022     Lab Results   Component Value Date    LDLCHOLESTEROL 95 04/30/2022     No results found for: LABVLDL, VLDL  No results found for: CHOLHDLRATIO   AAA:  Not indicated Patient age 46       Diabetes: Up to date No dx for DM  Lab Results   Component Value Date    NA 137 04/30/2022    K 4.6 04/30/2022    CL 103 04/30/2022    CO2 25 04/30/2022    BUN 21 (H) 04/30/2022    CREATININE 1.87 (H) 05/03/2022    GLUCOSE 183 (H) 04/30/2022    CALCIUM 9.7 04/30/2022    PROT 7.1 04/30/2022    PROT 7.0 04/30/2022    LABALBU 1130.8 05/03/2022    BILITOT 0.60 04/30/2022    ALKPHOS 66 04/30/2022    AST 15 04/30/2022    ALT 16 04/30/2022    LABGLOM 38 04/30/2022    GFRAA 47 (L) 06/24/2021    AGRATIO 2.70 04/30/2022    GLOB 3.7 04/30/2022      A1C Hemoglobin A1C, POC   Date Value Ref Range Status   04/30/2022 6.9 % Final      DM eye exam None found   DM foot exam Diabetic Foot Exam HM Status   Topic Date Due    Diabetic Foot Exam  05/01/2023      Microalb Done 04/30/22     Prostate Cancer:  Not indicated Patient age 11   PSA No results found for: PSA, PSASERMON, XBPSA, PSAFREETOTAL, PSAFRTOT, 4103, PSAD, ULTRAPSA, PSAEXT, PSAPOC, PSADIA     Influenza: Due Last given 10/01/21   Pneumonia: Not indicated Patient age 65   Shingrix: Not indicated Patient age 75   Td/Tdap: Due None found   COVID 19: Due 2 doses given     Active orders for HM gaps: None  HIN reviewed: Yes

## 2022-07-31 ENCOUNTER — Ambulatory Visit
Admit: 2022-07-31 | Discharge: 2022-07-31 | Payer: PRIVATE HEALTH INSURANCE | Attending: Student in an Organized Health Care Education/Training Program | Primary: Student in an Organized Health Care Education/Training Program

## 2022-07-31 DIAGNOSIS — Z0000 Encounter for general adult medical examination without abnormal findings: Secondary | ICD-10-CM

## 2022-07-31 LAB — AMB POC HEMOGLOBIN A1C: Hemoglobin A1C, POC: 6.7 %

## 2022-07-31 MED ORDER — EMPAGLIFLOZIN 10 MG PO TABS
10 MG | ORAL_TABLET | Freq: Every day | ORAL | 0 refills | Status: DC
Start: 2022-07-31 — End: 2022-09-24

## 2022-07-31 MED ORDER — CLOTRIMAZOLE-BETAMETHASONE 1-0.05 % EX CREA
CUTANEOUS | 0 refills | Status: AC
Start: 2022-07-31 — End: ?

## 2022-07-31 MED ORDER — ATORVASTATIN CALCIUM 40 MG PO TABS
40 MG | ORAL_TABLET | Freq: Every evening | ORAL | 3 refills | Status: DC
Start: 2022-07-31 — End: 2023-03-11

## 2022-07-31 MED ORDER — ALLOPURINOL 100 MG PO TABS
100 MG | ORAL_TABLET | Freq: Every day | ORAL | 1 refills | Status: DC
Start: 2022-07-31 — End: 2022-09-24

## 2022-07-31 MED ORDER — HYDROCHLOROTHIAZIDE 25 MG PO TABS
25 MG | ORAL_TABLET | Freq: Every morning | ORAL | 1 refills | Status: DC
Start: 2022-07-31 — End: 2023-01-12

## 2022-07-31 MED ORDER — COLCHICINE 0.6 MG PO TABS
0.6 MG | ORAL_TABLET | Freq: Every day | ORAL | 0 refills | Status: AC
Start: 2022-07-31 — End: 2023-05-28

## 2022-07-31 NOTE — Progress Notes (Signed)
[facility] [address]   [address] [place] [address]    CHIEF COMPLAINT     Peter Mueller is a 41 y.o. male who presents to clinic for Annual PE.    HISTORY OF PRESENT ILLNESS & REVIEW OF SYSTEMS     Concerns: None.     WELLNESS EVALUATION & HEALTH RISK ASSESSMENT      General health:  Feels well overall.   Diet / exercise:  "Been up and down."  Blood pressure:  140/86.     Oral health:  Not currently following with a dentist.     Substance use:    Tobacco - Current smoker  Alcohol - Occasional  Illicit drug use in the past year - No  Taking a prescription opiate - No    Depression screening:  PHQ-2 negative.     Skin concerns:  Yes, pruritic rash on LT upper arm.     PREVENTIVE CARE SCREENING & IMMUNIZATIONS     Diabetes:  A1C 6.9% on 04/30/2022.     Dyslipidemia:   Lab Results   Component Value Date    CHOL 157 04/30/2022    TRIG 160 (H) 04/30/2022    HDL 30 (L) 04/30/2022     Chronic kidney disease:   Lab Results   Component Value Date    GFRAA 47 (L) 06/24/2021    LABGLOM 38 04/30/2022    BUN 21 (H) 04/30/2022    NA 137 04/30/2022    K 4.6 04/30/2022    CL 103 04/30/2022    CO2 25 04/30/2022     Hepatitis C:  Negative 04/30/2022.   Abdominal aortic aneurysm ([place]):  Due age 64.   Colorectal cancer (FIT/Colonoscopy):  Due age 56.   Lung cancer (LDCT):  Due age 69.  Prostate cancer (PSA):  Due age 34.     Immunizations:  Immunization History   Administered Date(s) Administered    COVID-19, MODERNA BLUE border, Primary or Immunocompromised, (age 12y+), IM, 100 mcg/0.26mL 08/04/2020, 09/01/2020    Influenza Virus Vaccine 10/01/2020, 10/01/2021    Pneumococcal, PCV20, PREVNAR 20, (age 18y+), IM, 0.57mL 07/31/2022    TDaP, ADACEL (age 34y-64y), BOOSTRIX (age 10y+), IM, 0.64mL 07/31/2022     Due now  Pneumococcal   Tdap / Td Booster     PAST MEDICAL & SURGICAL HISTORY     Past Medical History:   Diagnosis Date    CKD (chronic kidney disease), stage III (HCC)     SPEP and UPEP negative for BJP  03/2022. Normal [place] 03/2022. C/S nephrology (Dr. Melton Alar) 07/2022.    DM2 (diabetes mellitus, type 2) (HCC)     Gout     HLD (hyperlipidemia)     HTN (hypertension)     Morbid obesity (HCC)      History reviewed. No pertinent surgical history.    MEDICATIONS     Current Outpatient Medications   Medication Sig    allopurinol (ZYLOPRIM) 100 MG tablet Take 2 tablets by mouth daily    colchicine (COLCRYS) 0.6 MG tablet Take 1 tablet by mouth daily Take 1.2 mg PO x 1, then 0.6 mg daily.    empagliflozin (JARDIANCE) 10 MG tablet Take 1 tablet by mouth daily    clotrimazole-betamethasone (LOTRISONE) 1-0.05 % cream Apply topically 2 times daily.    hydroCHLOROthiazide (HYDRODIURIL) 25 MG tablet Take 1 tablet by mouth every morning    atorvastatin (LIPITOR) 40 MG tablet Take 0.5 tablets by mouth nightly  allopurinol (ZYLOPRIM) 100 MG tablet Take 1 tablet by mouth daily    lisinopril (PRINIVIL;ZESTRIL) 40 MG tablet Take 1 tablet by mouth daily    amLODIPine (NORVASC) 10 MG tablet Take 1 tablet by mouth daily    metFORMIN (GLUCOPHAGE) 1000 MG tablet Take 1 tablet by mouth 2 times daily (with meals)    furosemide (LASIX) 40 MG tablet Take 1 tablet by mouth daily (Patient not taking: Reported on 07/31/2022)     No current facility-administered medications for this visit.     ALLERGIES     Allergies   Allergen Reactions    Nifedipine      Other reaction(s): Unknown (comments)  Allergy listed in HIN- no reaction noted.      FAMILY & SOCIAL HISTORY     Family History   Problem Relation Age of Onset    Hypertension Mother     Hypertension Father     Diabetes Father     Stroke Paternal Uncle         Age 3s     Social History     Social History Narrative    Lives in [place], [place].  [place] from [place].  Originally from [place], [place].  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  Single, no children.  Current smoker of 5-7 cigarettes per day (started age 89).  Occasional alcohol.  Daily cannabis use.  No hard drugs.        PHYSICAL EXAM     BP (!) 140/86    Pulse 88   Ht 6' 2.02" (1.88 m)   Wt (!) 366 lb (166 kg)   SpO2 99%   BMI 46.97 kg/m     GENERAL:  Well-nourished. Well-groomed. No acute distress.   EYES:  PERRL. EOMI. Conjunctiva and sclera clear.  EARS/NOSE/THROAT:  External auditory canals clear with grossly normal hearing. TMs clear BL. Oropharynx without lesions. MMM. Good dentition.  NECK:  Supple. No masses, thyromegaly, or adenopathy.  LUNGS:  Non-labored breathing. CTA without wheezes, rales, or rhonchi.  CARDIAC:  RRR. Normal S1/S2. No murmurs. Trace-1+ LEE.  ABDOMEN:  Soft. Non-tender. Non-distended. No obvious hernias.  SKIN:  2-3 cm flat patch of slightly hypopigmented and finely scaling skin on LT upper arm.   MUSCULOSKELETAL:  Normal muscle tone, bulk, and strength.   NEUROLOGIC:  Strength and sensation grossly intact in all 4 extremities.  PSYCHIATRIC:  Appropriate affect, makes good eye contact, normal concentration and attention.    ASSESSMENT & PLAN     1. Annual physical exam  BMI in morbid obese range - encouraged continued attention to diet and exercise.  BP elevated - see below.  Not due for any lab work or screenings.  Due for TDAP and PCV20, which were administered.  Provided with age-appropriate preventive care recommendations.    Patient received health guidance information on balanced nutrition, regular exercise, eating disorders and normal weight, tobacco and illicit drug avoidance, safe limits of alcohol consumption, safety belt and helmet use, hearing loss prevention, limiting UV/sun exposure and sunscreen use, depression symptoms, suicide prevention, self testicular exams, and personalized screening and health recommendations including age-appropriate vaccinations.    2. Need for vaccination  - Tdap, BOOSTRIX, (age 61 yrs+), IM  - Pneumococcal, PCV20, PREVNAR 20, (age 85 yrs+), IM, PF    3. Type 2 diabetes mellitus with stage 3b chronic kidney disease, unspecified whether long term insulin use (HCC)  A1C obtained today and  6.7%.  Refilled Jardiance 10 mg daily, which he  had not continued after he ran out.  He has not had any SEs from this.   - AMB POC HEMOGLOBIN A1C  - empagliflozin (JARDIANCE) 10 MG tablet; Take 1 tablet by mouth daily  Dispense: 90 tablet; Refill: 0    4. Stage 3b chronic kidney disease [facility])  Had C/S with nephrology (Dr. Verdie Mosher) 07/2022.  Refilled Jardiance as noted above.   - empagliflozin (JARDIANCE) 10 MG tablet; Take 1 tablet by mouth daily  Dispense: 90 tablet; Refill: 0    5. 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  We will continue to increase allopurinol, but while doing so, RX'd PPX colchicine.  Medication adjustments for use of colchicine included lowering atorvastatin to 20 mg daily (to reduce risk of myopathy) and discontinuing carvedilol (see below).  F/U 6 WKS.   - allopurinol (ZYLOPRIM) 100 MG tablet; Take 2 tablets by mouth daily  Dispense: 90 tablet; Refill: 1  - colchicine (COLCRYS) 0.6 MG tablet; Take 1 tablet by mouth daily Take 1.2 mg PO x 1, then 0.6 mg daily.  Dispense: 90 tablet; Refill: 0    6. Primary hypertension  BP 140/86 today.  Patient is continuing to have some issues with mild LEE.  He was RX'd Lasix by nephrology, however this would put increased strain on his kidneys.  Instead, will aim to reduce amlodipine and replace with a thiazide diuretic.  As we lower his uric acid level, we will be able to gradually increase the HCTZ.  RX'd HCTZ 25 mg today.  Since he cannot take colchicine with carvedilol, this was discontinued today, may resume it at a later date.  F/U 6 WKS.  - hydroCHLOROthiazide (HYDRODIURIL) 25 MG tablet; Take 1 tablet by mouth every morning  Dispense: 90 tablet; Refill: 1    7. Tinea corporis  LT upper arm.  RX'd topical Lotrisone.  F/U 6 WKS.  - clotrimazole-betamethasone (LOTRISONE) 1-0.05 % cream; Apply topically 2 times daily.   Dispense: 45 g; Refill: 0      Karsten Ro, MD  07/31/2022     PATIENT CARE TEAM  Patient Care Team:  Karsten Ro, MD as PCP - General

## 2022-07-31 NOTE — Progress Notes (Unsigned)
Last PE= None found    PREVENTIVE CARE - SCREENINGS  Colon cancer:  Not indicated Patient age 41   Lung cancer: Not indicated Patient age 41   Smoking Hx  reports that he has been smoking cigarettes. He has been smoking an average of .25 packs per day. He has never used smokeless tobacco.   Hepatitis C: Up to date Done 04/30/22-Negative   Lipids:  Up to date   Lab Results   Component Value Date    CHOL 157 04/30/2022     Lab Results   Component Value Date    TRIG 160 (H) 04/30/2022     Lab Results   Component Value Date    HDL 30 (L) 04/30/2022     Lab Results   Component Value Date    LDLCHOLESTEROL 95 04/30/2022     No results found for: LABVLDL, VLDL  No results found for: CHOLHDLRATIO   AAA:  Not indicated Patient age 71       Diabetes: Up to date No dx for DM  Lab Results   Component Value Date    NA 137 04/30/2022    K 4.6 04/30/2022    CL 103 04/30/2022    CO2 25 04/30/2022    BUN 21 (H) 04/30/2022    CREATININE 1.87 (H) 05/03/2022    GLUCOSE 183 (H) 04/30/2022    CALCIUM 9.7 04/30/2022    PROT 7.1 04/30/2022    PROT 7.0 04/30/2022    LABALBU 1130.8 05/03/2022    BILITOT 0.60 04/30/2022    ALKPHOS 66 04/30/2022    AST 15 04/30/2022    ALT 16 04/30/2022    LABGLOM 38 04/30/2022    GFRAA 47 (L) 06/24/2021    AGRATIO 2.70 04/30/2022    GLOB 3.7 04/30/2022      A1C Hemoglobin A1C, POC   Date Value Ref Range Status   04/30/2022 6.9 % Final      DM eye exam None found   DM foot exam Diabetic Foot Exam HM Status   Topic Date Due    Diabetic Foot Exam  05/01/2023      Microalb Done 04/30/22     Prostate Cancer:  Not indicated Patient age 68   PSA No results found for: PSA, PSASERMON, XBPSA, PSAFREETOTAL, PSAFRTOT, 4103, PSAD, ULTRAPSA, PSAEXT, PSAPOC, PSADIA     Influenza: Due Last given 10/01/21   Pneumonia: Not indicated Patient age 84   Shingrix: Not indicated Patient age 41   Td/Tdap: Due None found   COVID 19: Due 2 doses given     Active orders for HM gaps: None  HIN reviewed: Yes

## 2022-07-31 NOTE — Progress Notes (Signed)
A double time out was performed checking for correct patient; correct vaccine or diluents; correct time (includes administering correct age, appropriate interval and before vaccine or diluent expires); correct dosage; correct route, needle length and technique; correct site and correct documentation.  It was verified that the patient is not allergic to the vaccine and/or medication being given. The patient is not currently moderately or severely ill.      A second time out was done by Crystal Hathorn. See additional note for 2nd time out documentation.     VIS sheet given

## 2022-07-31 NOTE — Progress Notes (Signed)
Immunization/s administered 07/31/2022 by Loistine Simas, CMA with consent.   Patient tolerated procedure well.  No reactions noted.      A double time out was performed checking for correct patient; correct vaccine or diluents; correct time (includes administering correct age, appropriate interval and before vaccine or diluent expires); correct dosage; correct route, needle length and technique; correct site and correct documentation.  It was verified that the patient is not allergic to the vaccine and/or medication being given. The patient is not currently moderately or severely ill.      A second time out was done by [facility]. See additional note for 2nd time out documentation.     VIS sheet given

## 2022-09-23 ENCOUNTER — Ambulatory Visit
Payer: PRIVATE HEALTH INSURANCE | Attending: Student in an Organized Health Care Education/Training Program | Primary: Student in an Organized Health Care Education/Training Program

## 2022-09-23 NOTE — Telephone Encounter (Signed)
PSR contacted Peter Mueller to reschedule missed appointment. Patient has been rescheduled for 11/13/22 at 1:30 p.m. with Dr Elvia Collum.

## 2022-09-23 NOTE — Telephone Encounter (Signed)
Name and DOB verified.   Who called? Pt   Reason for cancelling? He is stuck at work   Appointment date being canceled: 10/24  Appointment time being canceled: 830am  Does patient want to be placed on waitlist: no  Was no show policy reviewed? no  Was Virtual Visit offered? (If applicable) yes    Please document if Pt declines virtual.    Pt declined VV and pt does want a call back to re-schedule.    (  I did not touch the appt, [place])    [phone]

## 2022-09-23 NOTE — Telephone Encounter (Addendum)
Pt no called no showed appointment today    No show #1 letter is sent out    Called pt to r/s on September 23, 2022 at 10:30

## 2022-09-24 ENCOUNTER — Ambulatory Visit
Admit: 2022-09-24 | Discharge: 2022-09-24 | Payer: PRIVATE HEALTH INSURANCE | Attending: Student in an Organized Health Care Education/Training Program | Primary: Student in an Organized Health Care Education/Training Program

## 2022-09-24 DIAGNOSIS — N1832 Chronic kidney disease, stage 3b: Secondary | ICD-10-CM

## 2022-09-24 MED ORDER — EMPAGLIFLOZIN 25 MG PO TABS
25 MG | ORAL_TABLET | Freq: Every day | ORAL | 1 refills | Status: DC
Start: 2022-09-24 — End: 2023-03-31

## 2022-09-24 MED ORDER — ALLOPURINOL 300 MG PO TABS
300 MG | ORAL_TABLET | Freq: Every day | ORAL | 1 refills | Status: DC
Start: 2022-09-24 — End: 2023-03-18

## 2022-09-24 NOTE — Progress Notes (Signed)
A second time out was performed checking for correct patient; correct vaccine or diluents; correct time (includes administering correct age, appropriate interval and before vaccine or diluent expires); correct dosage; correct route, needle length and technique; correct site and correct documentation.  It was verified that the patient is not allergic to the vaccine and/or medication being given. The patient is not currently moderately or severely ill.

## 2022-09-24 NOTE — Progress Notes (Signed)
Immunization/s administered 09/24/2022 by Breeze Angell L Zulma Court, CMA with consent.   Patient tolerated procedure well.  No reactions noted.      A double time out was performed checking for correct patient; correct vaccine or diluents; correct time (includes administering correct age, appropriate interval and before vaccine or diluent expires); correct dosage; correct route, needle length and technique; correct site and correct documentation.  It was verified that the patient is not allergic to the vaccine and/or medication being given. The patient is not currently moderately or severely ill.      A second time out was done by Tabitha Clark. See additional note for 2nd time out documentation.     VIS sheet given

## 2022-09-24 NOTE — Patient Instructions (Signed)
Increase Jardiance to 2 TABS (20 mg) daily until you run out, then switch to 25 mg tablets (sent to pharmacy).  Increase allopurinol to 2 TABS / 3 TABS (200 mg / 300 mg) every other day (alternating), until you run out. Then switch to new prescription of 1 TAB (300 mg) daily (sent to pharmacy.

## 2022-09-24 NOTE — Progress Notes (Signed)
[facility] [address]   [address] [place] [address]    CHIEF COMPLAINT     Peter Mueller is a 41 y.o. male who presents to clinic for chronic issues    HISTORY OF PRESENT ILLNESS     CKD / DM2:  Tolerating Jardiance 10 mg daily.      Gout:  Tolerating allopurinol 200 mg daily, no recent gout flares.     HTN:  BP under better control today, 126/74 (down from 140/86 at last visit).    REVIEW OF SYSTEMS     As per HPI.    PAST MEDICAL & SURGICAL HISTORY     Past Medical History:   Diagnosis Date    CKD (chronic kidney disease), stage III (HCC)     SPEP and UPEP negative for BJP 03/2022. Normal Korea 03/2022. C/S nephrology (Dr. Melton Alar) 07/2022.    DM2 (diabetes mellitus, type 2) (HCC)     Gout     HLD (hyperlipidemia)     HTN (hypertension)     Morbid obesity (HCC)      History reviewed. No pertinent surgical history.    MEDICATIONS     Current Outpatient Medications   Medication Sig    empagliflozin (JARDIANCE) 25 MG tablet Take 1 tablet by mouth daily    allopurinol (ZYLOPRIM) 300 MG tablet Take 1 tablet by mouth daily    colchicine (COLCRYS) 0.6 MG tablet Take 1 tablet by mouth daily Take 1.2 mg PO x 1, then 0.6 mg daily.    clotrimazole-betamethasone (LOTRISONE) 1-0.05 % cream Apply topically 2 times daily.    hydroCHLOROthiazide (HYDRODIURIL) 25 MG tablet Take 1 tablet by mouth every morning    atorvastatin (LIPITOR) 40 MG tablet Take 0.5 tablets by mouth nightly    lisinopril (PRINIVIL;ZESTRIL) 40 MG tablet Take 1 tablet by mouth daily    amLODIPine (NORVASC) 10 MG tablet Take 1 tablet by mouth daily    metFORMIN (GLUCOPHAGE) 1000 MG tablet Take 1 tablet by mouth 2 times daily (with meals)    furosemide (LASIX) 40 MG tablet Take 1 tablet by mouth daily (Patient not taking: Reported on 07/31/2022)     No current facility-administered medications for this visit.     ALLERGIES     Allergies   Allergen Reactions    Nifedipine      Other reaction(s): Unknown (comments)  Allergy listed in HIN-  no reaction noted.      FAMILY & SOCIAL HISTORY     Family History   Problem Relation Age of Onset    Hypertension Mother     Hypertension Father     Diabetes Father     Stroke Paternal Uncle         Age 95s     Social History     Social History Narrative    Lives in [place], [place].  [place] from [place].  Originally from [place], [place].  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  Single, no children.  Current smoker of 5-7 cigarettes per day (started age 72).  Occasional alcohol.  Daily cannabis use.  No hard drugs.        PHYSICAL EXAM     BP 126/74   Pulse (!) 116   Ht 1.88 m (6' 2.02")   Wt (!) 168.2 kg (370 lb 12.8 oz)   SpO2 96%   BMI 47.59 kg/m     GENERAL:  Appears well. No acute distress.   RESPIRATORY:  Non-labored breathing.   CARDIOVASCULAR:  Well perfused.   PSYCHIATRIC:  Appropriate affect.     ASSESSMENT AND PLAN     1. Stage 3b chronic kidney disease ([place])  Increased Jardiance to 25 mg daily.  F/U in 3 MO with repeat CMP.  - empagliflozin (JARDIANCE) 25 MG tablet; Take 1 tablet by mouth daily  Dispense: 90 tablet; Refill: 1  - Comprehensive Metabolic Panel; Future    2. Type 2 diabetes mellitus with stage 3b chronic kidney disease, without long-term current use of insulin (HCC)  Increased Jardiance to 25 mg daily.  F/U in 3 MO with repeat A1C.  - empagliflozin (JARDIANCE) 25 MG tablet; Take 1 tablet by mouth daily  Dispense: 90 tablet; Refill: 1  - Hemoglobin A1C; Future    3. Chronic gout without tophus  Increased allopurinol to 300 mg daily.  F/U in 3 MO with repeat uric acid level.  - allopurinol (ZYLOPRIM) 300 MG tablet; Take 1 tablet by mouth daily  Dispense: 90 tablet; Refill: 1  - Uric Acid; Future    4. Need for vaccination  Due for seasonal influenza vaccine, which was administered.   - Influenza, FLUARIX, (age 5 mo+),  IM, Preservative Free, 0.5 mL      Joella Prince, MD  09/24/2022

## 2022-11-13 ENCOUNTER — Encounter
Payer: PRIVATE HEALTH INSURANCE | Attending: Student in an Organized Health Care Education/Training Program | Primary: Student in an Organized Health Care Education/Training Program

## 2022-12-12 ENCOUNTER — Ambulatory Visit
Payer: PRIVATE HEALTH INSURANCE | Attending: Student in an Organized Health Care Education/Training Program | Primary: Student in an Organized Health Care Education/Training Program

## 2022-12-19 MED ORDER — AMLODIPINE BESYLATE 10 MG PO TABS
10 MG | ORAL_TABLET | Freq: Every day | ORAL | 3 refills | Status: AC
Start: 2022-12-19 — End: ?

## 2022-12-19 NOTE — Telephone Encounter (Signed)
Medications Requested:  Requested Prescriptions     Pending Prescriptions Disp Refills    amLODIPine (NORVASC) 10 MG tablet [Pharmacy Med Name: amLODIPine Besylate 10 MG Oral Tablet] 90 tablet 0     Sig: Take 1 tablet by mouth once daily       Preferred Pharmacy:   [facility], [address] [place] [phone] Wanda [address] [phone]  [place] [address]  Phone: [phone] Fax: [phone]      Date of Last Refill: 12/12/21    Prescription Refill Protocol reviewed:YES    Allergy List Reviewed and Verified: YES    Possible medication to medication interactions reviewed: YES    Last appt @ PCP Office: 09/24/2022     Future Appointments   Date Time Provider [place]   01/26/2023 10:30 AM Joella Prince, MD [facility]       MOST RECENT BLOOD PRESSURES  BP Readings from Last 3 Encounters:   09/24/22 126/74   07/31/22 (!) 140/86   04/30/22 (!) 140/92         MOST RECENT LAB DATA  Lab Results   Component Value Date/Time    K 4.6 04/30/2022 09:39 AM    ALT 16 04/30/2022 09:39 AM    CHOL 157 04/30/2022 09:39 AM    HGB 13.9 06/24/2021 10:47 PM    HCT 43.4 06/24/2021 10:47 PM    HBA1CPOC 6.7 07/31/2022 09:24 AM

## 2023-01-11 ENCOUNTER — Encounter

## 2023-01-12 MED ORDER — HYDROCHLOROTHIAZIDE 25 MG PO TABS
25 MG | ORAL_TABLET | Freq: Every morning | ORAL | 1 refills | Status: AC
Start: 2023-01-12 — End: 2023-07-20

## 2023-01-12 NOTE — Telephone Encounter (Signed)
Medications Requested:  Requested Prescriptions     Pending Prescriptions Disp Refills    hydroCHLOROthiazide (HYDRODIURIL) 25 MG tablet [Pharmacy Med Name: hydroCHLOROthiazide 25 MG Oral Tablet] 90 tablet 1     Sig: Take 1 tablet by mouth every morning       Preferred Pharmacy:   [facility], [address]  [place] [address]  Phone: [phone] Fax: [phone]      Date of Last Refill: 10/26/2022    Prescription Refill Protocol reviewed:YES    Allergy List Reviewed and Verified: YES    Possible medication to medication interactions reviewed: YES    Last appt @ PCP Office: 09/24/2022     Future Appointments   Date Time Provider [place]   01/26/2023 10:30 AM Joella Prince, MD [facility]       MOST RECENT BLOOD PRESSURES  BP Readings from Last 3 Encounters:   09/24/22 126/74   07/31/22 (!) 140/86   04/30/22 (!) 140/92         MOST RECENT LAB DATA  Lab Results   Component Value Date/Time    K 4.6 04/30/2022 09:39 AM    ALT 16 04/30/2022 09:39 AM    CHOL 157 04/30/2022 09:39 AM    HGB 13.9 06/24/2021 10:47 PM    HCT 43.4 06/24/2021 10:47 PM    HBA1CPOC 6.7 07/31/2022 09:24 AM       Lamonte Richer, MA  01/12/23  6:34 AM

## 2023-01-19 NOTE — Telephone Encounter (Signed)
Attempt #1. PSR attempted to contact Peter Mueller to reschedule 01/26/23 10:30 a.m. appointment as provider is out of the office. Generic message left on voicemail. If/when patient returns call please transfer patient to any PSR to reschedule appointment.      Peter Mueller can be reached at [phone]

## 2023-01-20 NOTE — Telephone Encounter (Signed)
PSR contacted Chalmer to reschedule 01/26/23 appointment. Patient stated it's cool I still haven't done what he wanted me to do. Patient has been rescheduled to 02/13/23 at 2:00 p.m. per Crystal-MA, ok to use time slot.        Don can be reached at [phone]

## 2023-01-26 ENCOUNTER — Encounter
Payer: PRIVATE HEALTH INSURANCE | Attending: Student in an Organized Health Care Education/Training Program | Primary: Student in an Organized Health Care Education/Training Program

## 2023-02-13 ENCOUNTER — Ambulatory Visit
Payer: PRIVATE HEALTH INSURANCE | Attending: Student in an Organized Health Care Education/Training Program | Primary: Student in an Organized Health Care Education/Training Program

## 2023-03-11 MED ORDER — ATORVASTATIN CALCIUM 40 MG PO TABS
40 | ORAL_TABLET | Freq: Every evening | ORAL | 3 refills | 30.00000 days | Status: DC
Start: 2023-03-11 — End: 2024-04-22

## 2023-03-11 MED ORDER — LISINOPRIL 40 MG PO TABS
40 MG | ORAL_TABLET | Freq: Every day | ORAL | 3 refills | Status: DC
Start: 2023-03-11 — End: 2024-03-14

## 2023-03-11 NOTE — Telephone Encounter (Signed)
Medications Requested:  Requested Prescriptions     Pending Prescriptions Disp Refills    lisinopril (PRINIVIL;ZESTRIL) 40 MG tablet [Pharmacy Med Name: Lisinopril 40 MG Oral Tablet] 90 tablet 0     Sig: Take 1 tablet by mouth once daily    atorvastatin (LIPITOR) 40 MG tablet [Pharmacy Med Name: Atorvastatin Calcium 40 MG Oral Tablet] 90 tablet 0     Sig: Take 1 tablet by mouth nightly       Preferred Pharmacy:   [facility] [address], [place] - [address] - [place] [phone] Carmon Ginsberg [phone]  [address]  [place] [address]  Phone: [phone] Fax: [phone]      Date of Last Refill: 07/31/22    Prescription Refill Protocol reviewed:YES    Allergy List Reviewed and Verified: YES    Possible medication to medication interactions reviewed: YES    Last appt @ PCP Office: 09/24/2022     Future Appointments   Date Time Provider Department Center   05/28/2023 10:00 AM Karsten Ro, MD [facility]       MOST RECENT BLOOD PRESSURES  BP Readings from Last 3 Encounters:   09/24/22 126/74   07/31/22 (!) 140/86   04/30/22 (!) 140/92         MOST RECENT LAB DATA  Lab Results   Component Value Date/Time    K 4.6 04/30/2022 09:39 AM    ALT 16 04/30/2022 09:39 AM    CHOL 157 04/30/2022 09:39 AM    HGB 13.9 06/24/2021 10:47 PM    HCT 43.4 06/24/2021 10:47 PM    HBA1CPOC 6.7 07/31/2022 09:24 AM

## 2023-03-18 ENCOUNTER — Encounter

## 2023-03-18 MED ORDER — ALLOPURINOL 300 MG PO TABS
300 MG | ORAL_TABLET | Freq: Every day | ORAL | 0 refills | Status: AC
Start: 2023-03-18 — End: 2023-06-15

## 2023-03-18 NOTE — Telephone Encounter (Signed)
Pt due for labs that have been ordered. Mychart message sent to pt    "Medication:  Allopurinol  Last Office Visit: 1 year  Lab Monitoring: Uric Acid annually  Category: Antigout  Length of refill:1 year  Brand Name: No  Comments:"    Medications Requested:  Requested Prescriptions     Pending Prescriptions Disp Refills    allopurinol (ZYLOPRIM) 300 MG tablet [Pharmacy Med Name: Allopurinol 300 MG Oral Tablet] 90 tablet 0     Sig: Take 1 tablet by mouth once daily       Preferred Pharmacy:   [facility] [address] [place] - [address] - [place] [phone] Carmon Ginsberg [phone]  [address]  [place] [address]  Phone: [phone] Fax: [phone]      Date of Last Refill: 09/24/22    Prescription Refill Protocol reviewed:YES    Allergy List Reviewed and Verified: YES    Possible medication to medication interactions reviewed: YES    Last appt @ PCP Office: 09/24/2022     Future Appointments   Date Time Provider Department Center   05/28/2023 10:00 AM Karsten Ro, MD [facility]       MOST RECENT BLOOD PRESSURES  BP Readings from Last 3 Encounters:   09/24/22 126/74   07/31/22 (!) 140/86   04/30/22 (!) 140/92         MOST RECENT LAB DATA  Lab Results   Component Value Date/Time    K 4.6 04/30/2022 09:39 AM    ALT 16 04/30/2022 09:39 AM    CHOL 157 04/30/2022 09:39 AM    HGB 13.9 06/24/2021 10:47 PM    HCT 43.4 06/24/2021 10:47 PM    HBA1CPOC 6.7 07/31/2022 09:24 AM

## 2023-03-20 ENCOUNTER — Inpatient Hospital Stay
Admit: 2023-03-20 | Payer: PRIVATE HEALTH INSURANCE | Primary: Student in an Organized Health Care Education/Training Program

## 2023-03-20 DIAGNOSIS — M1A9XX0 Chronic gout, unspecified, without tophus (tophi): Secondary | ICD-10-CM

## 2023-03-20 LAB — URIC ACID: Uric Acid: 6.3 MG/DL (ref 3.8–7.6)

## 2023-03-20 LAB — COMPREHENSIVE METABOLIC PANEL
ALT: 30 U/L (ref 3–35)
AST: 23 U/L (ref 15–40)
Albumin/Globulin Ratio: 0.9
Albumin: 3.6 g/dL (ref 3.5–5.0)
Alk Phosphatase: 73 U/L (ref 35–100)
Anion Gap: 17 mmol/L
BUN: 57 MG/DL — ABNORMAL HIGH (ref 7–20)
Bun/Cre Ratio: 18
CO2: 23 mmol/L (ref 20–32)
Calcium: 9.5 MG/DL (ref 8.8–10.5)
Chloride: 100 mmol/L (ref 100–110)
Creatinine: 3.21 MG/DL — ABNORMAL HIGH (ref 0.40–1.20)
Est, Glom Filt Rate: 24 mL/min/{1.73_m2}
Globulin: 3.8 g/dL
Glucose: 278 mg/dL — ABNORMAL HIGH (ref 75–110)
Potassium: 4.6 mmol/L (ref 3.5–5.0)
Sodium: 135 mmol/L (ref 135–145)
Total Bilirubin: 0.7 mg/dL (ref 0.10–1.20)
Total Protein: 7.4 g/dL (ref 6.2–8.0)

## 2023-03-21 LAB — HEMOGLOBIN A1C
Estimated Avg Glucose: 226 mg/dL
Hemoglobin A1C: 9.5 % — ABNORMAL HIGH (ref 4.0–6.0)

## 2023-03-23 NOTE — Telephone Encounter (Signed)
Call to [facility], he says he has had no NSAIDS, but could do better with hydrating, but does not feel he is dehydrated.  He will stop Jardiance and scheduled appt with Dr. Velva Harman to follow up.

## 2023-03-23 NOTE — Telephone Encounter (Signed)
-----   Message from Karsten Ro, MD sent at 03/20/2023  7:46 PM EDT -----  Renal function has dropped significantly.  Please screen him for hydration - is he consuming adequate water? NSAID use? If no other explanation, it is likely Jardiance.  He should stop this right away.  Please schedule him for a sooner appointment with another available provider within the next 1-2 weeks while I am away.  Thank you!

## 2023-03-31 ENCOUNTER — Ambulatory Visit
Admit: 2023-03-31 | Discharge: 2023-03-31 | Payer: PRIVATE HEALTH INSURANCE | Attending: Family Medicine | Primary: Student in an Organized Health Care Education/Training Program

## 2023-03-31 ENCOUNTER — Inpatient Hospital Stay
Admit: 2023-03-31 | Payer: PRIVATE HEALTH INSURANCE | Primary: Student in an Organized Health Care Education/Training Program

## 2023-03-31 DIAGNOSIS — N1832 Chronic kidney disease, stage 3b: Secondary | ICD-10-CM

## 2023-03-31 LAB — BASIC METABOLIC PANEL
Anion Gap: 15 mmol/L
BUN: 44 MG/DL — ABNORMAL HIGH (ref 7–20)
Bun/Cre Ratio: 16
CO2: 23 mmol/L (ref 20–32)
Calcium: 9.2 MG/DL (ref 8.8–10.5)
Chloride: 102 mmol/L (ref 100–110)
Creatinine: 2.77 MG/DL — ABNORMAL HIGH (ref 0.40–1.20)
Est, Glom Filt Rate: 28 mL/min/{1.73_m2}
Glucose: 219 mg/dL — ABNORMAL HIGH (ref 75–110)
Potassium: 4.6 mmol/L (ref 3.5–5.0)
Sodium: 135 mmol/L (ref 135–145)

## 2023-03-31 LAB — MICROALBUMIN / CREATININE URINE RATIO
Creatinine, Ur: 232.1 mg/dL
Microalbumin Creatinine Ratio: 108 mg/g — ABNORMAL HIGH (ref 0–30)
Microalbumin, Random Urine: 251 mg/L — ABNORMAL HIGH (ref 0.00–30.00)

## 2023-03-31 MED ORDER — LANTUS SOLOSTAR 100 UNIT/ML SC SOPN
100 UNIT/ML | Freq: Every evening | SUBCUTANEOUS | 0 refills | Status: DC
Start: 2023-03-31 — End: 2023-05-28

## 2023-03-31 MED ORDER — GLUCOSE MONITORING KIT: PACK | 0 refills | Status: AC

## 2023-03-31 NOTE — Assessment & Plan Note (Signed)
-  no more recent comparison than 03/2022/6/2023GFR/CKD  -we reviewed Crea has deteriorated from 1.8-2.16 baseline to 3.2 and GFR from previous of 38 to 24  -he has already stopped Jardiance  -will stop Metfrmin in light of low GFR  -will address DM with inuslin  -bp is normal, no sweling, no hematuria  -recheck levels today  -maintain 2 liters of water a day, avoid nephrotoxics, reassess bmp today and in 2 weeks  -he will call Nephrology to schedule his overdue ffup.   -parameters for ED ffup reviewed

## 2023-03-31 NOTE — Assessment & Plan Note (Signed)
-  lost to ffup with PCP since 08/2022  -A!c up to 9.5 from under 7  -has already stopped Jardiance per PCP  -will stop Metformin in light of GFR below 30  -recommended to transition to insulin  -we agreed to start Lantus 25 units SQ. action/effects and usual side effects reviewed in simple terms  -advised monitoring BS bid  -ffup with me in 2 weeks as PCP is on vacation  -will notify me if BS is running 350s or 70 and below.

## 2023-03-31 NOTE — Progress Notes (Addendum)
[facility]    03/31/2023      Assessment:      Diagnosis Orders   1. Type 2 diabetes mellitus with stage 3b chronic kidney disease, without long-term current use of insulin (HCC)  insulin glargine (LANTUS SOLOSTAR) 100 UNIT/ML injection pen    glucose monitoring kit      2. Stage 4 chronic kidney disease (HCC)  Basic Metabolic Panel    Microalbumin / Creatinine Urine Ratio    Basic Metabolic Panel           Plan:       1. Type 2 diabetes mellitus with stage 3b chronic kidney disease, without long-term current use of insulin (HCC)  Assessment & Plan:  -lost to ffup with PCP since 08/2022  -A!c up to 9.5 from under 7  -has already stopped Jardiance per PCP  -will stop Metformin in light of GFR below 30  -recommended to transition to insulin  -we agreed to start Lantus 25 units SQ. action/effects and usual side effects reviewed in simple terms  -advised monitoring BS bid  -ffup with me in 2 weeks as PCP is on vacation  -will notify me if BS is running 350s or 70 and below.   Orders:  -     insulin glargine (LANTUS SOLOSTAR) 100 UNIT/ML injection pen; Inject 25 Units into the skin nightly, Disp-5 Adjustable Dose Pre-filled Pen Syringe, R-0Normal  -     glucose monitoring kit; DAILY Starting Tue 03/31/2023, Disp-1 kit, R-0, NormalPlease dispense 1 month supply of test strips, test lancet, and all necessary supplies.  2. Stage 4 chronic kidney disease (HCC)  Assessment & Plan:  -no more recent comparison than 03/2022/6/2023GFR/CKD  -we reviewed Crea has deteriorated from 1.8-2.16 baseline to 3.2 and GFR from previous of 38 to 24  -he has already stopped Jardiance  -will stop Metfrmin in light of low GFR  -will address DM with inuslin  -bp is normal, no sweling, no hematuria  -recheck levels today  -maintain 2 liters of water a day, avoid nephrotoxics, reassess bmp today and in 2 weeks  -he will call Nephrology to schedule his overdue ffup.   -parameters for ED ffup reviewed   Orders:  -     Basic  Metabolic Panel; Future  -     Microalbumin / Creatinine Urine Ratio; Future  -     Basic Metabolic Panel; Future    07/2022 Nephrology notes reveiwed  28 pages on outside records reviewed  All 2022-2023 PCP notes reviewed   Labs trended,.     On the day of the encounter,I have spent over 40 minutes in the patient encounter including face to face evaluation, pre-charting and post visit charting on the same day via review of my previous notes, most recent specialty notes, most recent labs and trended with old ones, most recent imaging.   Meticulous documentation including HPI, Past Medical History, and Problem list updates/revision were accomplished  Significant time spent in reviewing diagnosis, management plan, and Health Maintenance/Preventive Measure updates/review.      Return in about 9 days (around 04/09/2023) for schedul on my open 10:40 for dm followup. .   Future Appointments   Date Time Provider Department Center   04/09/2023 10:40 AM Odesser Tourangeau, Jenny Reichmann, Peter Mueller [facility]   05/28/2023 10:00 AM Karsten Ro, Peter Mueller [facility] AM[facility]     Subjective:      Peter Mueller is a 42 y.o. male  who presents to  the office for dm and ckd    PCP: Karsten Ro, Peter Mueller     Patient is unknown to me 2023 PCP and Nephrology notes reveiwed.     DIABETES MELLITUS  In 07/2023, A1c 6.7.   In 08/2022, Peter Mueller, Peter Mueller, Peter Mueller maintained him on metfromin and Jardiance and advised ffup in 3 months.  In 03/31/23 , he presented for acute ffup per PCP as his last A1c a week ago was up to 9.5. His GFR and CREa deteriorated that his [name] was stopped.   In 03/31/23 , he admits to some dietary indescretion in the last month. Patient does not endorse polyuria, polyphagia, and polydipsia. I recommended to stop metformin as well. We agreed to Lantus 25 units and reassess in 2 weeks.       Hemoglobin A1C   Date Value Ref Range Status   03/20/2023 9.5 (H) 4.0 - 6.0 % Final     Hemoglobin A1C, POC   Date Value Ref Range Status   07/31/2022 6.7 % Final       Urine Microalbumin/Creatinine Ratio  Lab Results   Component Value Date/Time    MALBCR 502 04/30/2022 09:39 AM         CHRONIC KIDNEY DISEASE STAGE 3 NOW 4    In 2020, baseline crea was 2.0 and GFR 45  In 03/2022, renal work up per PCP was unrevealing including normal [place] of kidneys. Crea 2.1 and GFR 38  In 07/2022, he was evaluated by Dr. Verdie Mosher, Nephrology. Impression CKD multifactoral. Advised ffup in 4 months.   In 03/31/23  he presented acutely per PCP due to repeat GFR 24 and Crea 3.2. [name] denies chest pain, easy fatigability, lightheadedness, palpitations, PND, orthopnea, leg swelling, and syncope.  No oliguria/anuria. PCP has already stoppped his Jardiance.             Review of Systems:        Past Medical History:   Diagnosis Date    CKD (chronic kidney disease), stage III (HCC)     SPEP and UPEP negative for BJP 03/2022. Normal [place] 03/2022. C/S nephrology (Dr. Melton Alar) 07/2022.    DM2 (diabetes mellitus, type 2) (HCC)     Gout     HLD (hyperlipidemia)     HTN (hypertension)     Morbid obesity (HCC)        No past surgical history on file.     Family History   Problem Relation Age of Onset    Hypertension Mother     Hypertension Father     Diabetes Father     Stroke Paternal Uncle         Age 64s       Social History     Tobacco Use    Smoking status: Every Day     Current packs/day: 0.25     Types: Cigarettes    Smokeless tobacco: Never   Substance Use Topics    Alcohol use: Not Currently      Social History     Social History Narrative    Lives in [place], [place].  [place] from [place].  Originally from [place], [place].  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  Single, no children.  Current smoker of 5-7 cigarettes per day (started age 43).  Occasional alcohol.  Daily cannabis use.  No hard drugs.            Allergies   Allergen Reactions    Nifedipine  Other reaction(s): Unknown (comments)  Allergy listed in HIN- no reaction noted.         Current Outpatient Medications   Medication Sig    insulin glargine (LANTUS  SOLOSTAR) 100 UNIT/ML injection pen Inject 25 Units into the skin nightly    glucose monitoring kit 1 kit by Does not apply route daily Please dispense 1 month supply of test strips, test lancet, and all necessary supplies.    allopurinol (ZYLOPRIM) 300 MG tablet Take 1 tablet by mouth once daily    lisinopril (PRINIVIL;ZESTRIL) 40 MG tablet Take 1 tablet by mouth once daily    atorvastatin (LIPITOR) 40 MG tablet Take 1 tablet by mouth nightly    hydroCHLOROthiazide (HYDRODIURIL) 25 MG tablet Take 1 tablet by mouth every morning    amLODIPine (NORVASC) 10 MG tablet Take 1 tablet by mouth once daily    colchicine (COLCRYS) 0.6 MG tablet Take 1 tablet by mouth daily Take 1.2 mg PO x 1, then 0.6 mg daily.    clotrimazole-betamethasone (LOTRISONE) 1-0.05 % cream Apply topically 2 times daily.    metFORMIN (GLUCOPHAGE) 1000 MG tablet Take 1 tablet by mouth 2 times daily (with meals)     No current facility-administered medications for this visit.          Objective:     BP 122/66   Pulse 98   Resp 16   Ht 1.88 m (6' 2.02")   Wt (!) 166.3 kg (366 lb 11.2 oz)   SpO2 99%   BMI 47.06 kg/m    GENERAL: Patient is awake, alert, oriented, and  not in respiratory distress  HEAD: atraumatic  EYES: anicteric sclerae, pink palpebral conjunctivae. Pupils round, regular, and equally react to light  ORAL: moist buccal mucose, no posterior pharyngeal erythema, swelling, exudate.   NECK:supple, no lymphadenopathy, thyroid not enlarged  LUNGS: symmetric chest expansion, clear to auscultation, no wheezing, rhonchi, stridor, nor crackles.  HEART:regular rate and rhythm, normal S1/S2, no murmurs, no JVD  ABDOMEN:  Soft,  normo-active bowel sounds, non-tender, no guarding, no rebound, no rigidity, negative fluid wave, no organomegaly  EXTREMITIES:no edema, no cyanosis              Liev Brockbank Thyra Breed, Peter Mueller  03/31/2023

## 2023-04-07 ENCOUNTER — Encounter

## 2023-04-08 ENCOUNTER — Inpatient Hospital Stay
Admit: 2023-04-08 | Payer: PRIVATE HEALTH INSURANCE | Primary: Student in an Organized Health Care Education/Training Program

## 2023-04-08 DIAGNOSIS — N184 Chronic kidney disease, stage 4 (severe): Secondary | ICD-10-CM

## 2023-04-09 ENCOUNTER — Ambulatory Visit
Admit: 2023-04-09 | Discharge: 2023-04-09 | Payer: PRIVATE HEALTH INSURANCE | Attending: Family Medicine | Primary: Student in an Organized Health Care Education/Training Program

## 2023-04-09 DIAGNOSIS — N1832 Chronic kidney disease, stage 3b: Secondary | ICD-10-CM

## 2023-04-09 LAB — BASIC METABOLIC PANEL
Anion Gap: 15 mmol/L
BUN: 50 MG/DL — ABNORMAL HIGH (ref 7–20)
Bun/Cre Ratio: 20
CO2: 25 mmol/L (ref 20–32)
Calcium: 9.5 MG/DL (ref 8.8–10.5)
Chloride: 100 mmol/L (ref 100–110)
Creatinine: 2.48 MG/DL — ABNORMAL HIGH (ref 0.40–1.20)
Est, Glom Filt Rate: 32 mL/min/{1.73_m2}
Glucose: 140 mg/dL — ABNORMAL HIGH (ref 75–110)
Potassium: 4.2 mmol/L (ref 3.5–5.0)
Sodium: 136 mmol/L (ref 135–145)

## 2023-04-09 MED ORDER — LANTUS SOLOSTAR 100 UNIT/ML SC SOPN: 100 UNIT/ML | SUBCUTANEOUS | 0 refills | Status: DC

## 2023-04-11 NOTE — Assessment & Plan Note (Signed)
-  we reviewed his renal functions trends  -reassured Crea improved to 2.4 and GFR 32  -his urine microablumin last month was acceptable  -will continue to monitor and reminded on fluid intake, avoid nephrotoxics, and secure his overdue ffup with Nephrology

## 2023-04-11 NOTE — Assessment & Plan Note (Signed)
-  we reviewed his home BS readings which are still above goal  -we agreed to increase Lantus to 30 U, monitor Bs, ffup in 05/06/2023

## 2023-04-11 NOTE — Progress Notes (Signed)
[facility]    04/09/2023      Assessment:      Diagnosis Orders   1. Type 2 diabetes mellitus with stage 3b chronic kidney disease, without long-term current use of insulin (HCC)        2. Stage 3b chronic kidney disease (HCC)             Plan:       1. Type 2 diabetes mellitus with stage 3b chronic kidney disease, without long-term current use of insulin (HCC)  Assessment & Plan:  -we reviewed his home BS readings which are still above goal  -we agreed to increase Lantus to 30 U, monitor Bs, ffup in 05/06/2023  2. Stage 3b chronic kidney disease (HCC)  Assessment & Plan:   -we reviewed his renal functions trends  -reassured Crea improved to 2.4 and GFR 32  -his urine microablumin last month was acceptable  -will continue to monitor and reminded on fluid intake, avoid nephrotoxics, and secure his overdue ffup with Nephrology    07/2022 Nephrology notes reveiwed  28 pages on outside records reviewed  All 2022-2023 PCP notes reviewed   Labs trended,.     On the day of the encounter,I have spent over 40 minutes in the patient encounter including face to face evaluation, pre-charting and post visit charting on the same day via review of my previous notes, most recent specialty notes, most recent labs and trended with old ones, most recent imaging.   Meticulous documentation including HPI, Past Medical History, and Problem list updates/revision were accomplished  Significant time spent in reviewing diagnosis, management plan, and Health Maintenance/Preventive Measure updates/review.      Return in about 27 days (around 05/06/2023) for dm ffup. overbook on my 11:40 am .   Future Appointments   Date Time Provider Department Center   05/06/2023 11:40 AM Orlanda Lemmerman, Jenny Reichmann, MD [facility]   05/28/2023 10:00 AM Karsten Ro, MD [facility]     Subjective:      Peter Mueller is a 42 y.o. male  who presents to the office for dm and ckd    PCP: Karsten Ro, MD         DIABETES MELLITUS  In  07/2023, A1c 6.7.   In 08/2022, Rannie, Max L, MD maintained him on metfromin and Jardiance and advised ffup in 3 months.  In 03/31/23 , he presented for acute ffup per PCP as his last A1c a week ago was up to 9.5. His GFR and CREa deteriorated that his [name] was stopped.   In 03/31/23 , he admits to some dietary indescretion in the last month. Patient does not endorse polyuria, polyphagia, and polydipsia. I recommended to stop metformin as well. We agreed to Lantus 25 units and reassess in 2 weeks.   In 04/09/2023, he is tolerating Lantus 25 units. BS readings reviewed. No hypo. Will increase to lantus 30 units and reassess next month.       Hemoglobin A1C   Date Value Ref Range Status   03/20/2023 9.5 (H) 4.0 - 6.0 % Final     Hemoglobin A1C, POC   Date Value Ref Range Status   07/31/2022 6.7 % Final      Urine Microalbumin/Creatinine Ratio  Lab Results   Component Value Date/Time    MALBCR 108 03/31/2023 11:18 AM         CHRONIC KIDNEY DISEASE STAGE 3     In 2020, baseline  crea was 2.0 and GFR 45  In 03/2022, renal work up per PCP was unrevealing including normal [place] of kidneys. Crea 2.1 and GFR 38  In 07/2022, he was evaluated by Dr. Verdie Mosher, Nephrology. Impression CKD multifactoral. Advised ffup in 4 months.   In 03/31/23  he presented acutely per PCP due to repeat GFR 24 and Crea 3.2. Naoki denies chest pain, easy fatigability, lightheadedness, palpitations, PND, orthopnea, leg swelling, and syncope.  No oliguria/anuria. PCP has already stoppped his Jardiance.    In 04/09/2023, reassured his Crea was trending down and is at 2.4 and GFR trending up now at 32.        Review of Systems:        Past Medical History:   Diagnosis Date    CKD (chronic kidney disease), stage III (HCC)     SPEP and UPEP negative for BJP 03/2022. Normal [place] 03/2022. C/S nephrology (Dr. Melton Alar) 07/2022.    DM2 (diabetes mellitus, type 2) (HCC)     Gout     HLD (hyperlipidemia)     HTN (hypertension)     Morbid obesity (HCC)        History  reviewed. No pertinent surgical history.     Family History   Problem Relation Age of Onset    Hypertension Mother     Hypertension Father     Diabetes Father     Stroke Paternal Uncle         Age 25s       Social History     Tobacco Use    Smoking status: Every Day     Current packs/day: 0.25     Types: Cigarettes    Smokeless tobacco: Never   Substance Use Topics    Alcohol use: Not Currently      Social History     Social History Narrative    Lives in [place], [place].  [place] from [place].  Originally from [place], [place].  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  Single, no children.  Current smoker of 5-7 cigarettes per day (started age 72).  Occasional alcohol.  Daily cannabis use.  No hard drugs.            Allergies   Allergen Reactions    Nifedipine      Other reaction(s): Unknown (comments)  Allergy listed in HIN- no reaction noted.         Current Outpatient Medications   Medication Sig    insulin glargine (LANTUS SOLOSTAR) 100 UNIT/ML injection pen Inject 30 Units into the skin nightly    insulin glargine (LANTUS SOLOSTAR) 100 UNIT/ML injection pen Inject 25 Units into the skin nightly    glucose monitoring kit 1 kit by Does not apply route daily Please dispense 1 month supply of test strips, test lancet, and all necessary supplies.    allopurinol (ZYLOPRIM) 300 MG tablet Take 1 tablet by mouth once daily    lisinopril (PRINIVIL;ZESTRIL) 40 MG tablet Take 1 tablet by mouth once daily    atorvastatin (LIPITOR) 40 MG tablet Take 1 tablet by mouth nightly    hydroCHLOROthiazide (HYDRODIURIL) 25 MG tablet Take 1 tablet by mouth every morning    amLODIPine (NORVASC) 10 MG tablet Take 1 tablet by mouth once daily    colchicine (COLCRYS) 0.6 MG tablet Take 1 tablet by mouth daily Take 1.2 mg PO x 1, then 0.6 mg daily. (Patient not taking: Reported on 04/09/2023)    clotrimazole-betamethasone (LOTRISONE) 1-0.05 % cream Apply  topically 2 times daily. (Patient not taking: Reported on 04/09/2023)    metFORMIN (GLUCOPHAGE) 1000 MG tablet  Take 1 tablet by mouth 2 times daily (with meals) (Patient not taking: Reported on 04/09/2023)     No current facility-administered medications for this visit.          Objective:     BP 122/84   Pulse 90   Ht 1.88 m (6\' 2" )   Wt (!) 164.8 kg (363 lb 6.4 oz)   SpO2 97%   BMI 46.66 kg/m    GENERAL: Patient is awake, alert, oriented, and  not in respiratory distress  HEAD: atraumatic  EYES: anicteric sclerae, pink palpebral conjunctivae. Pupils round, regular, and equally react to light  ORAL: moist buccal mucose, no posterior pharyngeal erythema, swelling, exudate.   NECK:supple, no lymphadenopathy, thyroid not enlarged  LUNGS: symmetric chest expansion, clear to auscultation, no wheezing, rhonchi, stridor, nor crackles.  HEART:regular rate and rhythm, normal S1/S2, no murmurs, no JVD  ABDOMEN:  Soft,  normo-active bowel sounds, non-tender, no guarding, no rebound, no rigidity, negative fluid wave, no organomegaly  EXTREMITIES:no edema, no cyanosis              Kearstin Learn CHANGCO Shyquan Stallbaumer, MD  04/09/2023

## 2023-04-17 NOTE — Telephone Encounter (Signed)
closing open note

## 2023-05-06 ENCOUNTER — Encounter
Payer: PRIVATE HEALTH INSURANCE | Attending: Family Medicine | Primary: Student in an Organized Health Care Education/Training Program

## 2023-05-28 ENCOUNTER — Ambulatory Visit
Admit: 2023-05-28 | Discharge: 2023-05-28 | Payer: PRIVATE HEALTH INSURANCE | Attending: Student in an Organized Health Care Education/Training Program | Primary: Student in an Organized Health Care Education/Training Program

## 2023-05-28 DIAGNOSIS — I10 Essential (primary) hypertension: Secondary | ICD-10-CM

## 2023-05-28 MED ORDER — CLONIDINE 0.1 MG/24HR TD PTWK
0.1 | MEDICATED_PATCH | TRANSDERMAL | 3 refills | Status: DC
Start: 2023-05-28 — End: 2023-07-02

## 2023-05-28 NOTE — Progress Notes (Signed)
Diabetic foot exam:  - bilateral foot tingling  Left Foot:   Visual Exam: normal   Pulse DP: 2+ (normal)   Filament test: reduced sensation     Right Foot:   Visual Exam: normal   Pulse DP: 2+ (normal)   Filament test: reduced sensation

## 2023-05-28 NOTE — Progress Notes (Signed)
[facility] [address]   [address] [place] [address]    CHIEF COMPLAINT     Peter Mueller is a 42 y.o. male who presents to clinic for F/U Chronic Issues.    HISTORY OF PRESENT ILLNESS     DM2:  FG 110-120.  Pre-bedtime glucose 140-150.  Works night shifts. Currently taking Lantus 30 units QHS.  Reports progressive neuropathy.  Has not yet had a diabetic eye exam.  Most recent A1C 9.5 on 03/2023.     CKD:  Currently taking lisinopril 40 mg QD.  Had initial appointment with nephrology, but has not returned D/T financial issues.  Drinking water throughout the day.     HTN:  Currently taking lisinopril 40 mg QD, HCTZ 25 mg QD, amlodipine 10 mg QD.  BP 140/84 today.      REVIEW OF SYSTEMS     As per HPI.    PAST MEDICAL & SURGICAL HISTORY     Past Medical History:   Diagnosis Date    CKD (chronic kidney disease), stage III (HCC)     SPEP and UPEP negative for BJP 03/2022. Normal [place] 03/2022. C/S nephrology (Peter Mueller) 07/2022.    DM2 (diabetes mellitus, type 2) (HCC)     Gout     HLD (hyperlipidemia)     HTN (hypertension)     Morbid obesity (HCC)      History reviewed. No pertinent surgical history.    MEDICATIONS     Current Outpatient Medications   Medication Sig    cloNIDine (CATAPRES-TTS-1) 0.1 MG/24HR PTWK Place 1 patch onto the skin every 7 days    insulin glargine (LANTUS SOLOSTAR) 100 UNIT/ML injection pen Inject 30 Units into the skin nightly (Patient taking differently: Inject 34 Units into the skin nightly)    glucose monitoring kit 1 kit by Does not apply route daily Please dispense 1 month supply of test strips, test lancet, and all necessary supplies.    allopurinol (ZYLOPRIM) 300 MG tablet Take 1 tablet by mouth once daily    lisinopril (PRINIVIL;ZESTRIL) 40 MG tablet Take 1 tablet by mouth once daily    atorvastatin (LIPITOR) 40 MG tablet Take 1 tablet by mouth nightly    hydroCHLOROthiazide (HYDRODIURIL) 25 MG tablet Take 1 tablet by mouth every morning    amLODIPine  (NORVASC) 10 MG tablet Take 1 tablet by mouth once daily    clotrimazole-betamethasone (LOTRISONE) 1-0.05 % cream Apply topically 2 times daily. (Patient not taking: Reported on 04/09/2023)     No current facility-administered medications for this visit.     ALLERGIES     Allergies   Allergen Reactions    Nifedipine      Other reaction(s): Unknown (comments)  Allergy listed in HIN- no reaction noted.      FAMILY & SOCIAL HISTORY     Family History   Problem Relation Age of Onset    Hypertension Mother     Hypertension Father     Diabetes Father     Stroke Paternal Uncle         Age 73s     Social History     Social History Narrative    Lives in [place], [place].  [place] from [place].  Originally from [place], [place].  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  Single, no children.  Current smoker of 5-7 cigarettes per day (started age 64).  Occasional alcohol.  Daily cannabis use.  No hard drugs.  PHYSICAL EXAM     BP (!) 140/84   Pulse 73   Ht 1.88 m (6\' 2" )   Wt (!) 148.8 kg (328 lb)   SpO2 96%   BMI 42.11 kg/m     GENERAL:  Appears well. No acute distress.   RESPIRATORY:  Non-labored breathing.   CARDIOVASCULAR:  Well perfused.   FOOT EXAM:  Normal skin without calluses, ulcers, or lesions.  Normal toenails without thickening or discoloration.  Diminished sensation with monofilament testing BL.  Good perfusion with +2 DP/PT pulses BL.   PSYCHIATRIC:  Appropriate affect. Makes good eye contact. Good judgment and insight. Normal concentration and attention.    ASSESSMENT & PLAN     1. Primary hypertension  BP remains above goal.  At Peter Mueller doses on everything but HCTZ, avoiding increasing this D/T H/O gout.  RX'd clonidine patches.  F/U in 1 MO.   - cloNIDine (CATAPRES-TTS-1) 0.1 MG/24HR PTWK; Place 1 patch onto the skin every 7 days  Dispense: 4 patch; Refill: 3    2. Type 2 diabetes mellitus with diabetic polyneuropathy, with long-term current use of insulin (HCC)  3. Type 2 diabetes mellitus with stage 3b chronic kidney disease,  with long-term current use of insulin (HCC)  Increased Lantus to 34 units QD.  Will F/U in 1 MO with A1C and lab work beforehand.  Patient is having some financial stressors.  Advised him to apply for free care through [facility] ([facility]).  Today I also expressed some concerns that patient's night shift work may be having negative impacts on his health, including DM and HTN.  Suggested he consider switching back to  days.    - Lipid Panel; Future  - Hemoglobin A1C; Future  - Comprehensive Metabolic Panel; Future  - External Referral To Ophthalmology    4. Stage 3b chronic kidney disease (HCC)  EGFR improved back to >30.  If remains stable on next set of labs, will resume Jardiance.  Discussed keeping on top of hydration and avoidance of NSAIDs.       Peter Ro, MD  05/28/2023

## 2023-06-11 NOTE — Telephone Encounter (Signed)
Pt called back and states that he just called his insurance to update PCP information.

## 2023-06-11 NOTE — Telephone Encounter (Signed)
Peter Mueller sent to [facility] Desk Staff  Recived a message stating the PCP listed with patient's [facility] plan will need to be updated before a request for authorization can be submitted. Please reach out to the patient and have them contact [facility] and update the PCP listed to Peter Mueller, Peter Mueller Please advise once complete so we can move forward with this referral. Thank you.

## 2023-06-11 NOTE — Telephone Encounter (Signed)
Attempted to contact patient to make patient aware he needs to update his PCP on his insurance. Left generic voicemail for call back. If/when patient returns call, please make patient aware he needs to call his insurance company and have them update his PCP to Watson, Lanora Manis, MD, and to give [place] a call back once he has done this.

## 2023-06-13 ENCOUNTER — Encounter

## 2023-06-15 MED ORDER — ALLOPURINOL 300 MG PO TABS
300 | ORAL_TABLET | Freq: Every day | ORAL | 3 refills | Status: DC
Start: 2023-06-15 — End: 2024-06-20

## 2023-06-15 NOTE — Telephone Encounter (Signed)
Medications Requested:  Requested Prescriptions     Pending Prescriptions Disp Refills    allopurinol (ZYLOPRIM) 300 MG tablet [Pharmacy Med Name: Allopurinol 300 MG Oral Tablet] 90 tablet 0     Sig: Take 1 tablet by mouth once daily       Preferred Pharmacy:   [facility] [address] [place] - [address] - [place] [phone] Carmon Ginsberg [phone]  [address]  [place] [address]  Phone: [phone] Fax: [phone]      Date of Last Refill: 03/18/2023 for 90tab with Bethann Berkshire    Prescription Refill Protocol reviewed: Yes    "Medication:  Allopurinol  Last Office Visit: 1 year  Lab Monitoring: Uric Acid annually  Last Uric Acid Result Recorded: 03/20/2023  Category: Antigout  Length of refill:1 year  Brand Name: No  Comments: Refill Protocol Passed    Allergy List Reviewed and Verified: Yes    Possible medication to medication interactions reviewed: Yes    Last appt @ PCP Office: 05/28/2023     Future Appointments   Date Time Provider Department Center   07/02/2023  8:30 AM Karsten Ro, MD [facility]       MOST RECENT BLOOD PRESSURES  BP Readings from Last 3 Encounters:   05/28/23 (!) 140/84   04/09/23 122/84   03/31/23 122/66         MOST RECENT LAB DATA  Lab Results   Component Value Date/Time    K 4.2 04/08/2023 03:38 PM    ALT 30 03/20/2023 01:51 PM    CHOL 157 04/30/2022 09:39 AM    HGB 13.9 06/24/2021 10:47 PM    HCT 43.4 06/24/2021 10:47 PM    HBA1CPOC 6.7 07/31/2022 09:24 AM

## 2023-06-25 NOTE — Telephone Encounter (Signed)
Pt due for dm labs, please notify pt.    Future appointments: 07/02/23  Open lab orders: 05/28/23 cmp, A1c, lipids- may need reminder to get previsit labs    Thank you,  Corrie Dandy    Hemoglobin A1C   Date Value Ref Range Status   03/20/2023 9.5 (H) 4.0 - 6.0 % Final     Lab Results   Component Value Date/Time    HBA1CPOC 6.7 07/31/2022 09:24 AM      No results found for: "MCA2", "MCAU2"  Lab Results   Component Value Date/Time    CHOL 157 04/30/2022 09:39 AM    HDL 30 04/30/2022 09:39 AM    LDL 95 04/30/2022 09:39 AM      No results found for: "CRES", "CREAPOC"

## 2023-06-25 NOTE — Telephone Encounter (Signed)
Lab orders already in. Called and informed patient to have done prior to appt. Patient expressed understanding and will have them done. Advised fasting labs. Nothing further needed at this time.

## 2023-06-29 NOTE — Telephone Encounter (Signed)
Rx updated to what pt reported he is currently using. Ok to send as such?    Medications Requested:  Requested Prescriptions     Pending Prescriptions Disp Refills    LANTUS SOLOSTAR 100 UNIT/ML injection pen [Pharmacy Med Name: Lantus SoloStar 100 UNIT/ML Subcutaneous Solution Pen-injector] 15 mL 0     Sig: INJECT 30 UNITS SUBCUTANEOUSLY NIGHTLY       Preferred Pharmacy:   [facility] [address], [place] - [address] - [place] [phone] Carmon Ginsberg [phone]  [address]  [place] [address]  Phone: [phone] Fax: [phone]      Date of Last Refill: 04/09/23    Prescription Refill Protocol reviewed:YES    Allergy List Reviewed and Verified: YES    Possible medication to medication interactions reviewed: YES    Last appt @ PCP Office: 05/28/2023     Future Appointments   Date Time Provider Department Center   07/02/2023  8:30 AM Karsten Ro, MD [facility]       MOST RECENT BLOOD PRESSURES  BP Readings from Last 3 Encounters:   05/28/23 (!) 140/84   04/09/23 122/84   03/31/23 122/66         MOST RECENT LAB DATA  Lab Results   Component Value Date/Time    K 4.2 04/08/2023 03:38 PM    ALT 30 03/20/2023 01:51 PM    CHOL 157 04/30/2022 09:39 AM    HGB 13.9 06/24/2021 10:47 PM    HCT 43.4 06/24/2021 10:47 PM    HBA1CPOC 6.7 07/31/2022 09:24 AM

## 2023-06-30 MED ORDER — LANTUS SOLOSTAR 100 UNIT/ML SC SOPN
100 | Freq: Every evening | SUBCUTANEOUS | 3 refills | 50.00000 days | Status: DC
Start: 2023-06-30 — End: 2024-07-04

## 2023-07-01 ENCOUNTER — Inpatient Hospital Stay
Admit: 2023-07-01 | Payer: PRIVATE HEALTH INSURANCE | Primary: Student in an Organized Health Care Education/Training Program

## 2023-07-01 DIAGNOSIS — E1122 Type 2 diabetes mellitus with diabetic chronic kidney disease: Secondary | ICD-10-CM

## 2023-07-01 LAB — LIPID PANEL
Cholesterol, Total: 154 MG/DL (ref 115–200)
HDL: 25 MG/DL — ABNORMAL LOW (ref 40–60)
LDL Cholesterol: 93.6 MG/DL (ref 65–130)
Non-HDL Cholesterol: 129 mg/dL
Triglycerides: 177 MG/DL — ABNORMAL HIGH (ref 30–150)

## 2023-07-01 LAB — COMPREHENSIVE METABOLIC PANEL
ALT: 18 U/L (ref 3–35)
AST: 14 U/L — ABNORMAL LOW (ref 15–40)
Albumin/Globulin Ratio: 1
Albumin: 3.5 g/dL (ref 3.5–5.0)
Alk Phosphatase: 74 U/L (ref 35–100)
Anion Gap: 7 mmol/L — ABNORMAL LOW (ref 8–16)
BUN/Creatinine Ratio: 19
BUN: 50 MG/DL — ABNORMAL HIGH (ref 7–20)
CO2: 24 mmol/L (ref 20–32)
Calcium: 9.5 MG/DL (ref 8.8–10.5)
Chloride: 104 mmol/L (ref 100–110)
Creatinine: 2.7 MG/DL — ABNORMAL HIGH (ref 0.40–1.20)
Est, Glom Filt Rate: 29 mL/min/{1.73_m2}
Globulin: 3.6 g/dL
Glucose: 116 mg/dL — ABNORMAL HIGH (ref 75–110)
Potassium: 3.8 mmol/L (ref 3.5–5.0)
Sodium: 135 mmol/L (ref 135–145)
Total Bilirubin: 0.6 mg/dL (ref 0.10–1.20)
Total Protein: 7.1 g/dL (ref 6.2–8.0)

## 2023-07-02 ENCOUNTER — Ambulatory Visit
Admit: 2023-07-02 | Discharge: 2023-07-02 | Payer: PRIVATE HEALTH INSURANCE | Attending: Student in an Organized Health Care Education/Training Program | Primary: Student in an Organized Health Care Education/Training Program

## 2023-07-02 DIAGNOSIS — Z794 Long term (current) use of insulin: Secondary | ICD-10-CM

## 2023-07-02 LAB — HEMOGLOBIN A1C
Estimated Avg Glucose: 169 mg/dL
Hemoglobin A1C: 7.5 % — ABNORMAL HIGH (ref 4.0–6.0)

## 2023-07-02 MED ORDER — RYBELSUS 7 MG PO TABS
7 MG | ORAL_TABLET | ORAL | 0 refills | Status: DC
Start: 2023-07-02 — End: 2023-10-09

## 2023-07-02 MED ORDER — EMPAGLIFLOZIN 10 MG PO TABS
10 MG | ORAL_TABLET | Freq: Every day | ORAL | 3 refills | Status: AC
Start: 2023-07-02 — End: 2023-12-18

## 2023-07-02 MED ORDER — RYBELSUS 3 MG PO TABS
3 MG | ORAL_TABLET | ORAL | 0 refills | Status: DC
Start: 2023-07-02 — End: 2023-10-09

## 2023-07-02 MED ORDER — RYBELSUS 14 MG PO TABS
14 | ORAL_TABLET | ORAL | 3 refills | 84.00000 days | Status: DC
Start: 2023-07-02 — End: 2024-07-28

## 2023-07-02 NOTE — Progress Notes (Addendum)
[facility] [address]   [address] [place] [address]    CHIEF COMPLAINT     Peter Mueller is a 42 y.o. male who presents to clinic for Hypertension, Diabetes, and Chronic Kidney Disease    HISTORY OF PRESENT ILLNESS     HTN:  BP 110/74 today, down from 140/84 at last visit.  Did not need to use the clonidine patches.  Question whether he was adherent with his other medications at time of last visit.  Currently taking lisinopril 40 mg QD, HCTZ 25 mg QD, amlodipine 10 mg QD.     DM2:  A1C 7.5%, down from 9.5%.  Currently taking Lantus 33 units QHS.  He is working on his diet and purchased a diabetic cook book.    CKD:  Most recent renal function stable on 07/01/2023.  CR 2.7, BUN 50, EGFR 29.  He does not have another scheduled visit with nephrology  at this time.     REVIEW OF SYSTEMS     As per HPI.    PAST MEDICAL & SURGICAL HISTORY     Past Medical History:   Diagnosis Date    CKD (chronic kidney disease), stage III (HCC)     SPEP and UPEP negative for BJP 03/2022. Normal [place] 03/2022. C/S nephrology (Dr. Melton Alar) 07/2022.    DM2 (diabetes mellitus, type 2) (HCC)     Gout     HLD (hyperlipidemia)     HTN (hypertension)     Morbid obesity (HCC)      History reviewed. No pertinent surgical history.    MEDICATIONS     Current Outpatient Medications   Medication Sig    empagliflozin (JARDIANCE) 10 MG tablet Take 1 tablet by mouth daily    Semaglutide (RYBELSUS) 3 MG TABS Take 3 mg PO QD x 4 weeks, then increase to 7 mg PO QD x 4 weeks, then increase to 14 mg PO QD.    Semaglutide (RYBELSUS) 7 MG TABS Take 3 mg PO QD x 4 weeks, then increase to 7 mg PO QD x 4 weeks, then increase to 14 mg PO QD.    Semaglutide (RYBELSUS) 14 MG TABS Take 3 mg PO QD x 4 weeks, then increase to 7 mg PO QD x 4 weeks, then increase to 14 mg PO QD.    LANTUS SOLOSTAR 100 UNIT/ML injection pen Inject 34 Units into the skin nightly (Patient taking differently: Inject 37 Units into the skin nightly)    allopurinol  (ZYLOPRIM) 300 MG tablet Take 1 tablet by mouth daily    glucose monitoring kit 1 kit by Does not apply route daily Please dispense 1 month supply of test strips, test lancet, and all necessary supplies.    lisinopril (PRINIVIL;ZESTRIL) 40 MG tablet Take 1 tablet by mouth once daily    atorvastatin (LIPITOR) 40 MG tablet Take 1 tablet by mouth nightly    hydroCHLOROthiazide (HYDRODIURIL) 25 MG tablet Take 1 tablet by mouth every morning    amLODIPine (NORVASC) 10 MG tablet Take 1 tablet by mouth once daily    clotrimazole-betamethasone (LOTRISONE) 1-0.05 % cream Apply topically 2 times daily. (Patient not taking: Reported on 04/09/2023)     No current facility-administered medications for this visit.     ALLERGIES     Allergies   Allergen Reactions    Nifedipine      Other reaction(s): Unknown (comments)  Allergy listed in HIN- no reaction noted.  FAMILY & SOCIAL HISTORY     Family History   Problem Relation Age of Onset    Hypertension Mother     Hypertension Father     Diabetes Father     Stroke Paternal Uncle         Age 59s     Social History     Social History Narrative    Lives in [place], [place].  [place] from [place].  Originally from [place], [place].  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  Single, no children.  Current smoker of 5-7 cigarettes per day (started age 22).  Occasional alcohol.  Daily cannabis use.  No hard drugs.        PHYSICAL EXAM     BP 110/74   Pulse (!) 110   Ht 1.88 m (6' 2.02")   Wt (!) 157.9 kg (348 lb)   SpO2 96%   BMI 44.66 kg/m     GENERAL:  Appears well. No acute distress.   RESPIRATORY:  Non-labored breathing.   CARDIOVASCULAR:  Well perfused.   PSYCHIATRIC:  Appropriate affect.     ASSESSMENT & PLAN     1. Primary hypertension  BP Improved.  No changes made today.  Continue monitoring.    2. Type 2 diabetes mellitus with stage 4 chronic kidney disease, with long-term current use of insulin (HCC)  A1C improved.  Will start patient on Rybelsus and gradually wean down insulin.  RX sent for 3 MO  titration.  Lantus kept at 33 units QD.  Continue working on diet.  F/U 3 MO with repeat A1C.  No FAMHX of MTC or MEN.  No personal H/O pancreatitis.  - Semaglutide (RYBELSUS) 3 MG TABS; Take 3 mg PO QD x 4 weeks, then increase to 7 mg PO QD x 4 weeks, then increase to 14 mg PO QD.  Dispense: 30 tablet; Refill: 0  - Semaglutide (RYBELSUS) 7 MG TABS; Take 3 mg PO QD x 4 weeks, then increase to 7 mg PO QD x 4 weeks, then increase to 14 mg PO QD.  Dispense: 30 tablet; Refill: 0  - Semaglutide (RYBELSUS) 14 MG TABS; Take 3 mg PO QD x 4 weeks, then increase to 7 mg PO QD x 4 weeks, then increase to 14 mg PO QD.  Dispense: 90 tablet; Refill: 3  - Hemoglobin A1C; Future    3. Stage 4 chronic kidney disease (HCC)  Will reintroduce Jardiance for renal protection.  RX sent for 10 mg QD.  F/U 3 MO with repeat CMP.    - empagliflozin (JARDIANCE) 10 MG tablet; Take 1 tablet by mouth daily  Dispense: 90 tablet; Refill: 3  - Comprehensive Metabolic Panel; Future      Karsten Ro, MD  07/02/2023

## 2023-07-09 ENCOUNTER — Telehealth

## 2023-07-09 NOTE — Telephone Encounter (Signed)
[facility] - Heidi called to get an Insurance referral for this pt.     Dr. 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    Ph: [phone]   Fax: [phone]

## 2023-07-09 NOTE — Telephone Encounter (Signed)
Referral queued if PCP agreeable to send.

## 2023-07-10 NOTE — Telephone Encounter (Signed)
Noted. Referral has been ordered

## 2023-07-20 ENCOUNTER — Encounter

## 2023-07-20 MED ORDER — HYDROCHLOROTHIAZIDE 25 MG PO TABS
25 | ORAL_TABLET | Freq: Every morning | ORAL | 3 refills | Status: AC
Start: 2023-07-20 — End: ?

## 2023-07-20 NOTE — Telephone Encounter (Signed)
Medications Requested:  Requested Prescriptions     Pending Prescriptions Disp Refills    hydroCHLOROthiazide (HYDRODIURIL) 25 MG tablet [Pharmacy Med Name: hydroCHLOROthiazide 25 MG Oral Tablet] 90 tablet 0     Sig: TAKE 1 TABLET BY MOUTH ONCE DAILY IN THE [facility]       Preferred Pharmacy:   [facility] [address] [place] - [address] - [place] [phone] Carmon Ginsberg [phone]  [address]  [place] [address]  Phone: [phone] Fax: [phone]      Date of Last Refill: 01/12/23    Prescription Refill Protocol reviewed:YES    Allergy List Reviewed and Verified: YES    Possible medication to medication interactions reviewed: YES    Last appt @ PCP Office: 07/02/2023     Future Appointments   Date Time Provider Department Center   10/09/2023  1:30 PM Karsten Ro, MD [facility]       MOST RECENT BLOOD PRESSURES  BP Readings from Last 3 Encounters:   07/02/23 110/74   05/28/23 (!) 140/84   04/09/23 122/84         MOST RECENT LAB DATA  Lab Results   Component Value Date/Time    K 3.8 07/01/2023 04:06 PM    ALT 18 07/01/2023 04:06 PM    CHOL 154 07/01/2023 04:06 PM    HGB 13.9 06/24/2021 10:47 PM    HCT 43.4 06/24/2021 10:47 PM    HBA1CPOC 6.7 07/31/2022 09:24 AM

## 2023-08-02 ENCOUNTER — Encounter

## 2023-08-04 NOTE — Telephone Encounter (Signed)
 Not on medication protocol list, please review and sign if appropriate.    Medications Requested:  Requested Prescriptions     Pending Prescriptions Disp Refills    Semaglutide  (RYBELSUS ) 3 MG TABS [Pharmacy Med Name: Rybelsus  3 MG Oral Tablet] 30 tablet 0     Sig: TAKE 1 TABLET BY MOUTH ONCE DAILY FOR  30  DAYS  THEN  INCREASE  TO  7MG   TABLET       Preferred Pharmacy:   [facility] [address] [place] - [address] - [place] [phone] [facility] [phone]  [address]  [place] [address]  Phone: [phone] Fax: [phone]      Date of Last Refill: 07/02/2023    Prescription Refill Protocol reviewed:YES    Allergy List Reviewed and Verified: YES    Possible medication to medication interactions reviewed: YES    Last appt @ PCP Office: 07/02/2023     Future Appointments   Date Time Provider Department Center   10/09/2023  1:30 PM Darreld Royden CROME, MD [facility]       MOST RECENT BLOOD PRESSURES  BP Readings from Last 3 Encounters:   07/02/23 110/74   05/28/23 (!) 140/84   04/09/23 122/84         MOST RECENT LAB DATA  Lab Results   Component Value Date/Time    K 3.8 07/01/2023 04:06 PM    ALT 18 07/01/2023 04:06 PM    CHOL 154 07/01/2023 04:06 PM    HGB 13.9 06/24/2021 10:47 PM    HCT 43.4 06/24/2021 10:47 PM    HBA1CPOC 6.7 07/31/2022 09:24 AM

## 2023-09-30 NOTE — Progress Notes (Signed)
Last PE= 07/31/22    PREVENTIVE CARE - SCREENINGS  Colon cancer:  Not indicated Patient age 42   Lung cancer: Not indicated Patient age 83   Smoking Hx  reports that he has been smoking cigarettes. He has never used smokeless tobacco.   Hepatitis C: Up to date Done 04/30/22-Negative   Lipids:  Up to date   Lab Results   Component Value Date    CHOL 154 07/01/2023    CHOL 157 04/30/2022     Lab Results   Component Value Date    TRIG 177 (H) 07/01/2023    TRIG 160 (H) 04/30/2022     Lab Results   Component Value Date    HDL 25 (L) 07/01/2023    HDL 30 (L) 04/30/2022     Lab Results   Component Value Date    LDL 93.6 07/01/2023    LDL 95 04/30/2022     No results found for: "VLDL"  No results found for: "CHOLHDLRATIO"   AAA:  Not indicated Patient age 42       Diabetes: Due A1C, Eye exam  Patient dx DMT2  Lab Results   Component Value Date    NA 135 07/01/2023    K 3.8 07/01/2023    CL 104 07/01/2023    CO2 24 07/01/2023    BUN 50 (H) 07/01/2023    CREATININE 2.70 (H) 07/01/2023    GLUCOSE 116 (H) 07/01/2023    CALCIUM 9.5 07/01/2023    LABALBU 116.1 04/30/2022    BILITOT 0.60 07/01/2023    ALKPHOS 74 07/01/2023    AST 14 (L) 07/01/2023    ALT 18 07/01/2023    LABGLOM 29 07/01/2023    GFRAA 47 (L) 06/24/2021    AGRATIO 2.70 04/30/2022    GLOB 3.6 07/01/2023      A1C Hemoglobin A1C   Date Value Ref Range Status   07/01/2023 7.5 (H) 4.0 - 6.0 % Final     Hemoglobin A1C, POC   Date Value Ref Range Status   07/31/2022 6.7 % Final      DM eye exam None found   DM foot exam Diabetic Foot Exam HM Status   Topic Date Due    Diabetic Foot Exam  05/27/2024      Microalb Done 03/31/23     Prostate Cancer:  Not indicated Patient age 32   PSA No results found for: "PSA", "PSASERMON", "XBPSA", "PSAFREETOTAL", "PSAFRTOT", "PSAEXT", "PSAPOC"     Influenza: Up to date Given 09/14/23, found in immpact, chart updated   Pneumonia: Up to date 20 given 07/31/22   Shingrix: Not indicated Patient age 74   Td/Tdap: Up to date Tdap given 07/31/22    COVID 19: Due 2 doses given     RSV: Not indicated Patient age 71     Active orders for HM gaps: A1C, CMP  HIN reviewed: Yes

## 2023-10-08 ENCOUNTER — Inpatient Hospital Stay
Admit: 2023-10-08 | Payer: PRIVATE HEALTH INSURANCE | Primary: Student in an Organized Health Care Education/Training Program

## 2023-10-08 DIAGNOSIS — E1122 Type 2 diabetes mellitus with diabetic chronic kidney disease: Secondary | ICD-10-CM

## 2023-10-08 LAB — COMPREHENSIVE METABOLIC PANEL
ALT: 13 U/L (ref 3–35)
AST: 13 U/L — ABNORMAL LOW (ref 15–40)
Albumin/Globulin Ratio: 1.2
Albumin: 3.7 g/dL (ref 3.5–5.7)
Alk Phosphatase: 76 U/L (ref 34–104)
Anion Gap: 9 mmol/L (ref 3–16)
BUN/Creatinine Ratio: 15
BUN: 38 mg/dL — ABNORMAL HIGH (ref 7–25)
CO2: 26 mmol/L (ref 20–32)
Calcium: 9.3 mg/dL (ref 8.6–10.3)
Chloride: 104 mmol/L (ref 98–107)
Creatinine: 2.61 mg/dL — ABNORMAL HIGH (ref 0.7–1.3)
Est, Glom Filt Rate: 30 mL/min/{1.73_m2}
Globulin: 3 g/dL
Glucose: 118 mg/dL — ABNORMAL HIGH (ref 70–99)
Potassium: 4 mmol/L (ref 3.5–5.1)
Sodium: 139 mmol/L (ref 136–145)
Total Bilirubin: 0.5 mg/dL (ref 0.1–1.2)
Total Protein: 6.7 g/dL (ref 6.0–8.3)

## 2023-10-09 ENCOUNTER — Ambulatory Visit
Admit: 2023-10-09 | Discharge: 2023-10-09 | Payer: PRIVATE HEALTH INSURANCE | Attending: Student in an Organized Health Care Education/Training Program | Primary: Student in an Organized Health Care Education/Training Program

## 2023-10-09 VITALS — BP 114/68 | HR 110 | Ht 74.0 in | Wt 327.7 lb

## 2023-10-09 DIAGNOSIS — Z0000 Encounter for general adult medical examination without abnormal findings: Secondary | ICD-10-CM

## 2023-10-09 LAB — HEMOGLOBIN A1C
Estimated Avg Glucose: 131 mg/dL
Hemoglobin A1C: 6.2 %

## 2023-10-09 MED ORDER — SENNOSIDES 8.6 MG PO TABS
8.6 | ORAL_TABLET | Freq: Two times a day (BID) | ORAL | 11 refills | Status: AC | PRN
Start: 2023-10-09 — End: 2024-10-08

## 2023-10-09 NOTE — Progress Notes (Signed)
[facility] [address]   [address] [place] [address]    CHIEF COMPLAINT     Peter Mueller is a 42 y.o. male who presents to clinic for Annual Exam    HISTORY OF PRESENT ILLNESS & REVIEW OF SYSTEMS     Concerns:     DM2:  Currently taking Rybelsus 14 mg QD, Jardiance 10 mg, Lantus 34 units SC QHS.  A1C 6.2% on 10/08/2023.     CKD:  Renal function stable on most recent lab work 10/08/2023 with EGFR of 30.     WELLNESS EVALUATION & HEALTH RISK ASSESSMENT      General health:  Feeling well.   Diet:  Has lost 20 LBS with Rybelsus.   Exercise:  WT-lifting regularly.   Blood pressure:  114/68 today.     Oral health:  Not currently following with a dentist.     Substance use:    Tobacco - Current smoker  Alcohol - Occasional  Illicit drug use in the past year - No  Taking a prescription opiate - No    Depression screening:  PHQ-2 negative.     Skin concerns:  None.     PREVENTIVE CARE SCREENING & IMMUNIZATIONS     Diabetes:   Lab Results   Component Value Date    LABA1C 6.2 10/08/2023     Dyslipidemia:   Lab Results   Component Value Date    CHOL 154 07/01/2023    TRIG 177 (H) 07/01/2023    HDL 25 (L) 07/01/2023     Chronic kidney disease:   Lab Results   Component Value Date    GFRAA 47 (L) 06/24/2021    LABGLOM 30 10/08/2023    BUN 38 (H) 10/08/2023    NA 139 10/08/2023    K 4.0 10/08/2023    CL 104 10/08/2023    CO2 26 10/08/2023     Hepatitis C:  Negative 04/30/2022.   Abdominal aortic aneurysm ([place]):  Due age 29.   Colorectal cancer (FIT/Colonoscopy):  Due age 27.   Lung cancer (LDCT):  Due age 21.  Prostate cancer (PSA):  Due age 52.     Immunizations:  Immunization History   Administered Date(s) Administered    COVID-19, MODERNA BLUE border, Primary or Immunocompromised, (age 12y+), IM, 100 mcg/0.33mL 08/04/2020, 09/01/2020    Influenza Virus Vaccine 10/01/2020, 10/01/2021, 09/14/2023    Influenza, FLUARIX, FLULAVAL, FLUZONE (age 12 mo+) and AFLURIA, (age 60 y+), Quadv PF, 0.28mL 09/24/2022     Pneumococcal, PCV20, PREVNAR 20, (age 6w+), IM, 0.34mL 07/31/2022    TDaP, ADACEL (age 37y-64y), Leda Min (age 10y+), IM, 0.69mL 07/31/2022     PAST MEDICAL & SURGICAL HISTORY     Past Medical History:   Diagnosis Date    CKD (chronic kidney disease), stage III (HCC)     SPEP and UPEP negative for BJP 03/2022. Normal [place] 03/2022. C/S nephrology (Dr. Melton Alar) 07/2022.    DM2 (diabetes mellitus, type 2) (HCC)     Gout     HLD (hyperlipidemia)     HTN (hypertension)     Morbid obesity      History reviewed. No pertinent surgical history.    MEDICATIONS     Current Outpatient Medications   Medication Sig    senna (SENOKOT) 8.6 MG tablet Take 1 tablet by mouth 2 times daily as needed for Constipation    hydroCHLOROthiazide (HYDRODIURIL) 25 MG tablet Take 1 tablet by mouth every morning  empagliflozin (JARDIANCE) 10 MG tablet Take 1 tablet by mouth daily    Semaglutide (RYBELSUS) 14 MG TABS Take 3 mg PO QD x 4 weeks, then increase to 7 mg PO QD x 4 weeks, then increase to 14 mg PO QD.    LANTUS SOLOSTAR 100 UNIT/ML injection pen Inject 34 Units into the skin nightly    allopurinol (ZYLOPRIM) 300 MG tablet Take 1 tablet by mouth daily    glucose monitoring kit 1 kit by Does not apply route daily Please dispense 1 month supply of test strips, test lancet, and all necessary supplies.    lisinopril (PRINIVIL;ZESTRIL) 40 MG tablet Take 1 tablet by mouth once daily    atorvastatin (LIPITOR) 40 MG tablet Take 1 tablet by mouth nightly    amLODIPine (NORVASC) 10 MG tablet Take 1 tablet by mouth once daily    clotrimazole-betamethasone (LOTRISONE) 1-0.05 % cream Apply topically 2 times daily.     No current facility-administered medications for this visit.     ALLERGIES     Allergies   Allergen Reactions    Nifedipine      Other reaction(s): Unknown (comments)  Allergy listed in HIN- no reaction noted.      FAMILY & SOCIAL HISTORY     Family History   Problem Relation Age of Onset    Hypertension Mother     Hypertension Father      Diabetes Father     Stroke Paternal Uncle         Age 55s     Social History     Social History Narrative    Lives in [place], [place].  [place] from [place].  Originally from [place], [place].  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  Single, no children.  Current smoker of 5-7 cigarettes per day (started age 3).  Occasional alcohol.  Daily cannabis use.  No hard drugs.        PHYSICAL EXAM     BP 114/68 (Site: Left Upper Arm, Position: Sitting)   Pulse (!) 110   Ht 1.88 m (6\' 2" )   Wt (!) 148.6 kg (327 lb 11.2 oz)   SpO2 98%   BMI 42.07 kg/m     GENERAL:  Well-nourished. Well-groomed. No acute distress.   EYES:  PERRL. EOMI. Conjunctiva and sclera clear.  EARS/NOSE/THROAT:  External auditory canals clear with grossly normal hearing. TMs clear BL. Oropharynx without lesions. MMM. Fair dentition.  NECK:  Supple. No masses, thyromegaly, or adenopathy.  LUNGS:  Non-labored breathing. CTA without wheezes, rales, or rhonchi.  CARDIAC:  RRR. Normal S1/S2. No murmurs. BL LEs without edema.  ABDOMEN:  Central obesity.   SKIN:  Limited exam - no rashes or lesions.  MUSCULOSKELETAL:  Normal muscle tone, bulk, and strength.   NEUROLOGIC:  Strength and sensation grossly intact in all 4 extremities.  PSYCHIATRIC:  Appropriate affect, makes good eye contact, normal concentration and attention.    ASSESSMENT & PLAN     1. Annual physical exam  BMI in morbid obese range - commended patient on 20 LBS WL and discussed further lifestyle changes to maintain this.  BP within healthy range.  Reviewed recent lab work - no concerns.  Not due for any screenings.  UTD on immunizations.  Provided with age-appropriate preventive care recommendations.     Patient received health guidance information on balanced nutrition, regular exercise, eating disorders and normal weight, tobacco and illicit drug avoidance, safe limits of alcohol consumption, safety belt and helmet use,  hearing loss prevention, limiting UV/sun exposure and sunscreen use, depression  symptoms, suicide prevention, self testicular exams, and personalized screening and health recommendations including age-appropriate vaccinations.    2. Drug-induced constipation  Rybelsus-induced.  Will trial senna PRN.  F/U in 2 MO.   - senna (SENOKOT) 8.6 MG tablet; Take 1 tablet by mouth 2 times daily as needed for Constipation  Dispense: 60 tablet; Refill: 11    3. Type 2 diabetes mellitus with stage 3b chronic kidney disease, with long-term current use of insulin (HCC)  Improved based on recent A1C.  At goal, if not slightly aggressively treated.  Reduce Lantus to 28 units QHS.  F/U in 2 MO at which time may further lower and increase Jardiance.      4. Stage 3b chronic kidney disease (HCC)  Stable on recent lab work.  Plan as above for increase in Jardiance.       Karsten Ro, MD  10/09/2023     PATIENT CARE TEAM  Patient Care Team:  Karsten Ro, MD as PCP - General

## 2023-10-09 NOTE — Progress Notes (Signed)
Last PE= 07/31/22    PREVENTIVE CARE - SCREENINGS  Colon cancer:  Not indicated Patient age 42   Lung cancer: Not indicated Patient age 83   Smoking Hx  reports that he has been smoking cigarettes. He has never used smokeless tobacco.   Hepatitis C: Up to date Done 04/30/22-Negative   Lipids:  Up to date   Lab Results   Component Value Date    CHOL 154 07/01/2023    CHOL 157 04/30/2022     Lab Results   Component Value Date    TRIG 177 (H) 07/01/2023    TRIG 160 (H) 04/30/2022     Lab Results   Component Value Date    HDL 25 (L) 07/01/2023    HDL 30 (L) 04/30/2022     Lab Results   Component Value Date    LDL 93.6 07/01/2023    LDL 95 04/30/2022     No results found for: "VLDL"  No results found for: "CHOLHDLRATIO"   AAA:  Not indicated Patient age 42       Diabetes: Due A1C, Eye exam  Patient dx DMT2  Lab Results   Component Value Date    NA 135 07/01/2023    K 3.8 07/01/2023    CL 104 07/01/2023    CO2 24 07/01/2023    BUN 50 (H) 07/01/2023    CREATININE 2.70 (H) 07/01/2023    GLUCOSE 116 (H) 07/01/2023    CALCIUM 9.5 07/01/2023    LABALBU 116.1 04/30/2022    BILITOT 0.60 07/01/2023    ALKPHOS 74 07/01/2023    AST 14 (L) 07/01/2023    ALT 18 07/01/2023    LABGLOM 29 07/01/2023    GFRAA 47 (L) 06/24/2021    AGRATIO 2.70 04/30/2022    GLOB 3.6 07/01/2023      A1C Hemoglobin A1C   Date Value Ref Range Status   07/01/2023 7.5 (H) 4.0 - 6.0 % Final     Hemoglobin A1C, POC   Date Value Ref Range Status   07/31/2022 6.7 % Final      DM eye exam None found   DM foot exam Diabetic Foot Exam HM Status   Topic Date Due    Diabetic Foot Exam  05/27/2024      Microalb Done 03/31/23     Prostate Cancer:  Not indicated Patient age 32   PSA No results found for: "PSA", "PSASERMON", "XBPSA", "PSAFREETOTAL", "PSAFRTOT", "PSAEXT", "PSAPOC"     Influenza: Up to date Given 09/14/23, found in immpact, chart updated   Pneumonia: Up to date 20 given 07/31/22   Shingrix: Not indicated Patient age 74   Td/Tdap: Up to date Tdap given 07/31/22    COVID 19: Due 2 doses given     RSV: Not indicated Patient age 71     Active orders for HM gaps: A1C, CMP  HIN reviewed: Yes

## 2023-10-09 NOTE — Patient Instructions (Addendum)
Lower insulin to 28 units nightly.    Check out [facility].     GENERAL HEALTH  The best way to live healthy is to have a healthy lifestyle by eating a well-balanced diet, exercising regularly, limiting alcohol and stopping smoking if you are a smoker.  Try to exercise at least 30 minutes a day, this is better than any prescription medicine to keep you healthy.   Eat at least 5 servings of fruits and vegetables every daily and reduce red meats, processed meat (such as deli cold cuts), white breads and pastas. Limit or eliminate sweets and sugary drinks from your diet.  Try to keep your weight healthy. Your body mass index (BMI) should be between 18 and 25. If it is between 25 and 30 you are overweight and if it is 30 or higher, that is called obesity. Being overweight or obese increases risk of high blood pressure, arthritis, sleep apnea, diabetes and many cancers.  High blood pressure causes damage to your blood vessels, heart, kidneys and other organs if untreated. Your blood pressure should be under 140/90 with an ideal level of 120/80 or less to prevent heart disease, strokes and kidney disease.  Wear your seat belt every time you are in a vehicle.   Use sunscreen (SPF 15-30) or cover your skin when you are outdoors. 1 in 60 people will develop melanoma (skin cancer) which is mostly preventable.  Smoking makes all health problems worse. If you smoke, talk to your provider about stop-smoking programs and medicines.  See a dentist one or two times a year for checkups and to have your teeth cleaned.   Annual eye exams are recommended to screen for glaucoma and cataracts.     IMMUNIZATIONS  Everyone should have a yearly flu shot   Tetanus, Diphtheria & Pertussis (TDaP) vaccine is recommended every 10 years.  The shingles vaccine called Shingrix is recommended once in a lifetime after age 22. This is given as a 2-dose series and you can get it at your local pharmacy. Shingrix is now recommended instead of the  older Zostavax as it is 90% effective compared to 50% for the Zostavax. You can get the Shingrix vaccine even if you had the Zostavax in the past.  All people over age 56 should have a pneumonia vaccine to prevent pneumonia. This is called Pneumovax-23 and is once in a lifetime vaccines except in special cases. Some people may benefit from a different vaccine called Prevnar-13 in addition to Pneumovax-23.    PREVENTIVE SCREENING  Screening for diabetes mellitus with a blood sugar test should be done annually if you have risk factors such as high blood pressure, high cholesterol, are overweight, have a family history of diabetes or you have had high blood sugars in the past, especially during pregnancy (gestational diabetes).  Cholesterol tests check for elevated lipids (fatty part of blood) which can lead to heart disease and strokes are recommended every 5 years after age 59. If you have elevated cholesterol, diabetes or have had a heart attack or stroke you should have testing more frequently.  Colon cancer screening that evaluates for blood or polyps in your colon can prevent colon cancer and should be done yearly as a stool test or every 10 years as a colonoscopy up to age 75. Screening can be done up to age 19 if you are still very healthy but has higher risks after age 62. Colonoscopies may be recommended more frequently if precancerous lesions were found.  Men are eligible for a blood screening blood test for prostate cancer called PSA. The current recommendation is to consider this between ages 13 and 49 only as men over 16 do not seem to benefit from this screening. Men under 70 can benefit to some degree and whether to have this test is a decision that you should think about and discuss with your provider. If you have a family history of prostate cancer, the recommendation may be different.  Abdominal Aortic Aneurysm (AAA) screening at 65 is recommended if you ever smoked or have a family history of  AAA.  Lung Cancer Screening with an annual low-dose CT scan is recommended if you are between age 60 and 42, smoked at least 30 pack years (the equivalent of 1 pack per day for 30 years), and are a current smoker or quit less than 15 years ago.  Hepatitis C screening is recommended one time for everyone.  HIV and syphilis screening are recommended if you are risk.

## 2023-12-08 NOTE — Telephone Encounter (Signed)
 Medications Requested:  alcium-Channel Blockers Protocol Passed01/05/2024 05:01 AM   Protocol Details Last Pulse reading greater than 50 recorded within past year    Visit with authorizing provider in past 9 months or upcoming 90 days     To be filled at: [facility] [address] [place] - [address] - [place] [phone] - F [phone]   Requested Prescriptions     Pending Prescriptions Disp Refills    amLODIPine  (NORVASC ) 10 MG tablet [Pharmacy Med Name: amLODIPine  Besylate 10 MG Oral Tablet] 90 tablet 0     Sig: Take 1 tablet by mouth once daily       Preferred Pharmacy:   [facility] [address] [place] - [address] - [place] [phone] [facility] [phone]  [address]  [place] [address]  Phone: [phone] Fax: [phone]      Date of Last Refill: 12/19/2022    Prescription Refill Protocol reviewed: Yes    Allergy List Reviewed and Verified: Yes    Possible medication to medication interactions reviewed: Yes    Last appt @ PCP Office: 10/09/2023     Future Appointments   Date Time Provider Department Center   12/11/2023  1:30 PM Darreld Royden CROME, MD [facility]       MOST RECENT BLOOD PRESSURES  BP Readings from Last 3 Encounters:   10/09/23 114/68   07/02/23 110/74   05/28/23 (!) 140/84         MOST RECENT LAB DATA  Lab Results   Component Value Date/Time    K 4.0 10/08/2023 05:05 PM    ALT 13 10/08/2023 05:05 PM    CHOL 154 07/01/2023 04:06 PM    HGB 13.9 06/24/2021 10:47 PM    HCT 43.4 06/24/2021 10:47 PM    HBA1CPOC 6.7 07/31/2022 09:24 AM

## 2023-12-09 MED ORDER — AMLODIPINE BESYLATE 10 MG PO TABS
10 | ORAL_TABLET | Freq: Every day | ORAL | 3 refills | 90.00000 days | Status: DC
Start: 2023-12-09 — End: 2024-07-28

## 2023-12-18 ENCOUNTER — Inpatient Hospital Stay
Admit: 2023-12-18 | Payer: PRIVATE HEALTH INSURANCE | Primary: Student in an Organized Health Care Education/Training Program

## 2023-12-18 ENCOUNTER — Ambulatory Visit
Admit: 2023-12-18 | Discharge: 2023-12-18 | Payer: PRIVATE HEALTH INSURANCE | Attending: Student in an Organized Health Care Education/Training Program | Primary: Student in an Organized Health Care Education/Training Program

## 2023-12-18 DIAGNOSIS — N1832 Chronic kidney disease, stage 3b: Secondary | ICD-10-CM

## 2023-12-18 MED ORDER — EMPAGLIFLOZIN 25 MG PO TABS
25 | ORAL_TABLET | Freq: Every day | ORAL | 1 refills | 90.00000 days | Status: DC
Start: 2023-12-18 — End: 2024-07-28

## 2023-12-18 NOTE — Progress Notes (Signed)
[facility] [address]   [address] [place] [address]    CHIEF COMPLAINT     Peter Mueller is a 43 y.o. male who presents to clinic for Follow-up Chronic Condition    HISTORY OF PRESENT ILLNESS     DM2:  Currently taking Rybelsus 14 mg QD, Jardiance 10 mg QD, Lantus 28 units QD.  His blood glucose levels typically range from 100 to 110 in the morning and 130 to 140 at night, with occasional spikes to 160. He has been adhering to a healthier diet, primarily consisting of chicken and salad, with a monthly indulgence in steak. He has lost an additional 16 LBS since last visit, for a total of 59 LBS since 08/2022.     REVIEW OF SYSTEMS     As per HPI.    PAST MEDICAL & SURGICAL HISTORY     Past Medical History:   Diagnosis Date    CKD (chronic kidney disease), stage III (HCC)     SPEP and UPEP negative for BJP 03/2022. Normal [place] 03/2022. C/S nephrology (Dr. Melton Alar) 07/2022.    DM2 (diabetes mellitus, type 2) (HCC)     Gout     On allopurinol.    HLD (hyperlipidemia)     HTN (hypertension)     Morbid obesity      History reviewed. No pertinent surgical history.    MEDICATIONS     Current Outpatient Medications   Medication Sig    empagliflozin (JARDIANCE) 25 MG tablet Take 1 tablet by mouth daily    amLODIPine (NORVASC) 10 MG tablet Take 1 tablet by mouth daily    senna (SENOKOT) 8.6 MG tablet Take 1 tablet by mouth 2 times daily as needed for Constipation    hydroCHLOROthiazide (HYDRODIURIL) 25 MG tablet Take 1 tablet by mouth every morning    Semaglutide (RYBELSUS) 14 MG TABS Take 3 mg PO QD x 4 weeks, then increase to 7 mg PO QD x 4 weeks, then increase to 14 mg PO QD.    LANTUS SOLOSTAR 100 UNIT/ML injection pen Inject 34 Units into the skin nightly (Patient taking differently: Inject 20 Units into the skin nightly)    allopurinol (ZYLOPRIM) 300 MG tablet Take 1 tablet by mouth daily    glucose monitoring kit 1 kit by Does not apply route daily Please dispense 1 month supply of test  strips, test lancet, and all necessary supplies.    lisinopril (PRINIVIL;ZESTRIL) 40 MG tablet Take 1 tablet by mouth once daily    atorvastatin (LIPITOR) 40 MG tablet Take 1 tablet by mouth nightly    clotrimazole-betamethasone (LOTRISONE) 1-0.05 % cream Apply topically 2 times daily.     No current facility-administered medications for this visit.     ALLERGIES     Allergies   Allergen Reactions    Nifedipine      Other reaction(s): Unknown (comments)  Allergy listed in HIN- no reaction noted.      FAMILY & SOCIAL HISTORY     Family History   Problem Relation Age of Onset    Hypertension Mother     Hypertension Father     Diabetes Father     Stroke Paternal Uncle         Age 20s     Social History     Social History Narrative    Lives in [place], [place].  [place] to [place] from [place].  Originally from [place], [place].  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  Single, no children.  Current smoker of 5-7 cigarettes per day (started age 31).  Occasional alcohol.  Daily cannabis use.  No hard drugs.        PHYSICAL EXAM     Wt (!) 141.3 kg (311 lb 8 oz)   BMI 39.99 kg/m     GENERAL:  Appears well. No acute distress.   RESPIRATORY:  Non-labored breathing.   CARDIOVASCULAR:  Well perfused.   PSYCHIATRIC:  Appropriate affect.    ASSESSMENT & PLAN     1. Class 2 severe obesity due to excess calories with serious comorbidity and body mass index (BMI) of 39.0 to 39.9 in adult  2. Type 2 diabetes mellitus with stage 3b chronic kidney disease, with long-term current use of insulin (HCC)  3. Stage 3b chronic kidney disease (HCC)  Commended patient on WL.  Will increase Jardiance to 25 mg QD after checking a CMP to ensure renal function stability.  Lower Lantus to 20 units QD, and by a further 2-4 units for glucose readings <80.  He can also add ACV (2 TBSP daily), cinnamon (1000-2000 mg BID), turmeric (1000 mg BID), and ginger (250-500 mg QAC and QHS) to further improve insulin resistance and aid with discontinuation of insulin.   - Comprehensive  Metabolic Panel; Future  - empagliflozin (JARDIANCE) 25 MG tablet; Take 1 tablet by mouth daily  Dispense: 90 tablet; Refill: 1      Kaitrin Seybold Shiela Mayer, MD  12/18/2023

## 2023-12-18 NOTE — Patient Instructions (Addendum)
Supplements:   Apple cider vinegar (with the "mother") - 2 TBSP per day (mixed with water, tea or taken as a shot)  2. Ginger root 250-500 mg capsules before meals and bedtime  3. Cinnamon 1000-2000 mg capsules twice daily  4. Turmeric 1000 mg capsules twice daily  5. "[employer]" multivitamin    Increase Jardiance to 2 TABS daily until you receive the new prescription, then 1 TAB daily.  Lower insulin to 20 units daily, and another 2-4 units if starting to notice readings in the 80-100 range.

## 2023-12-19 LAB — COMPREHENSIVE METABOLIC PANEL
ALT: 12 U/L (ref 3–35)
AST: 12 U/L — ABNORMAL LOW (ref 15–40)
Albumin/Globulin Ratio: 1.3
Albumin: 3.9 g/dL (ref 3.5–5.7)
Alk Phosphatase: 66 U/L (ref 34–104)
Anion Gap: 10 mmol/L (ref 3–16)
BUN/Creatinine Ratio: 15
BUN: 41 mg/dL — ABNORMAL HIGH (ref 7–25)
CO2: 25 mmol/L (ref 20–32)
Calcium: 9.6 mg/dL (ref 8.6–10.3)
Chloride: 105 mmol/L (ref 98–107)
Creatinine: 2.7 mg/dL — ABNORMAL HIGH (ref 0.7–1.3)
Est, Glom Filt Rate: 29 mL/min/{1.73_m2}
Globulin: 2.9 g/dL
Glucose: 116 mg/dL — ABNORMAL HIGH (ref 70–99)
Potassium: 4 mmol/L (ref 3.5–5.1)
Sodium: 140 mmol/L (ref 136–145)
Total Bilirubin: 0.7 mg/dL (ref 0.1–1.2)
Total Protein: 6.8 g/dL (ref 6.0–8.3)

## 2023-12-21 NOTE — Telephone Encounter (Signed)
-----   Message from Dr. Yetta Barre, MD sent at 12/19/2023 10:25 AM EST -----  Please notify patient that lab work looks good, can proceed with plan from last viist.  He should repeat labs prior to next visit.

## 2023-12-21 NOTE — Telephone Encounter (Signed)
Letter sent via portal

## 2024-03-14 MED ORDER — LISINOPRIL 40 MG PO TABS
40 | ORAL_TABLET | Freq: Every day | ORAL | 3 refills | 90.00000 days | Status: DC
Start: 2024-03-14 — End: 2024-07-28

## 2024-03-14 NOTE — Telephone Encounter (Signed)
 Medications Requested:  Requested Prescriptions     Pending Prescriptions Disp Refills    lisinopril (PRINIVIL;ZESTRIL) 40 MG tablet [Pharmacy Med Name: Lisinopril 40 MG Oral Tablet] 90 tablet 3     Sig: Take 1 tablet by mouth daily       Preferred Pharmacy:   [facility] [address] [place] - [address] - [place] [phone] Carmon Ginsberg [phone]  [address]  [place] [address]  Phone: [phone] Fax: [phone]      Date of Last Refill: 12/18/2023    Prescription Refill Protocol reviewed:YES    Allergy List Reviewed and Verified: YES    Possible medication to medication interactions reviewed: YES    Last appt @ PCP Office: 12/18/2023     Future Appointments   Date Time Provider Department Center   04/08/2024  3:00 PM Karsten Ro, MD [facility]       MOST RECENT BLOOD PRESSURES  BP Readings from Last 3 Encounters:   10/09/23 114/68   07/02/23 110/74   05/28/23 (!) 140/84         MOST RECENT LAB DATA  Lab Results   Component Value Date/Time    K 4.0 12/18/2023 03:23 PM    ALT 12 12/18/2023 03:23 PM    CHOL 154 07/01/2023 04:06 PM    HGB 13.9 06/24/2021 10:47 PM    HCT 43.4 06/24/2021 10:47 PM    HBA1CPOC 6.7 07/31/2022 09:24 AM       Andres Shad, MA  03/14/24  11:04 AM

## 2024-04-08 ENCOUNTER — Encounter
Payer: PRIVATE HEALTH INSURANCE | Attending: Student in an Organized Health Care Education/Training Program | Primary: Student in an Organized Health Care Education/Training Program

## 2024-04-21 NOTE — Telephone Encounter (Signed)
 Pt due for pre-visit dm labs, please notify pt.    Future appointments: 04/29/24 OV  Open lab orders: cmp, A1c, also due for lipids, uACR    Thank you,  Mary    Hemoglobin A1C   Date Value Ref Range Status   10/08/2023 6.2 % Final     Lab Results   Component Value Date/Time    HBA1CPOC 6.7 07/31/2022 09:24 AM      No results found for: "MCA2", "MCAU2"  Lab Results   Component Value Date/Time    CHOL 154 07/01/2023 04:06 PM    HDL 25 07/01/2023 04:06 PM    LDL 93.6 07/01/2023 04:06 PM      No results found for: "CRES", "CREAPOC"

## 2024-04-21 NOTE — Telephone Encounter (Signed)
 Any other labs you want ordered?

## 2024-04-21 NOTE — Telephone Encounter (Signed)
 Patient aware with no further questions/concerns at this time.

## 2024-04-22 MED ORDER — ATORVASTATIN CALCIUM 40 MG PO TABS
40 | ORAL_TABLET | Freq: Every evening | ORAL | 0 refills | 30.00000 days | Status: AC
Start: 2024-04-22 — End: ?

## 2024-04-22 NOTE — Telephone Encounter (Signed)
 Hmg CoA Reductase Inhibitors Protocol Passed05/23/2025 05:01 AM   Protocol Details Last Lipid panel resulted within the past 12 months    Visit with authorizing provider in past 9 months or upcoming 90 days    Last AST and ALT levels < or = to 3x normal within the past 3 years  Refill protocol not met as it has been over 1 year since last refill                        Medications Requested:                    Requested Prescriptions     Pending Prescriptions Disp Refills    atorvastatin  (LIPITOR) 40 MG tablet [Pharmacy Med Name: Atorvastatin  Calcium  40 MG Oral Tablet] 90 tablet 0     Sig: Take 1 tablet by mouth nightly       Preferred Pharmacy:   [facility] [address], [place] - [address] - [place] [phone] Kaylene Pascal [phone]  [address]  [place] [address]  Phone: [phone] Fax: [phone]      Date of Last Refill: 03/11/2023    Prescription Refill Protocol reviewed:YES    Allergy List Reviewed and Verified: YES    Possible medication to medication interactions reviewed: YES    Last appt @ PCP Office: 12/18/2023     Future Appointments   Date Time Provider Department Center   04/29/2024 11:00 AM Lanier Pitch, MD [facility]       MOST RECENT BLOOD PRESSURES  BP Readings from Last 3 Encounters:   10/09/23 114/68   07/02/23 110/74   05/28/23 (!) 140/84         MOST RECENT LAB DATA  Lab Results   Component Value Date/Time    K 4.0 12/18/2023 03:23 PM    ALT 12 12/18/2023 03:23 PM    CHOL 154 07/01/2023 04:06 PM    HGB 13.9 06/24/2021 10:47 PM    HCT 43.4 06/24/2021 10:47 PM    HBA1CPOC 6.7 07/31/2022 09:24 AM                                       Medications Requested:  Requested Prescriptions     Pending Prescriptions Disp Refills    atorvastatin  (LIPITOR) 40 MG tablet [Pharmacy Med Name: Atorvastatin  Calcium  40 MG Oral Tablet] 90 tablet 0     Sig: Take 1 tablet by mouth nightly       Preferred Pharmacy:   [facility] [address], [place] - [address] - [place] [phone] Kaylene Pascal  [phone]  [address]  [place] [address]  Phone: [phone] Fax: [phone]      Date of Last Refill: 03/11/2023    Prescription Refill Protocol reviewed:YES    Allergy List Reviewed and Verified: YES    Possible medication to medication interactions reviewed: YES    Last appt @ PCP Office: 12/18/2023     Future Appointments   Date Time Provider Department Center   04/29/2024 11:00 AM Lanier Pitch, MD [facility]       MOST RECENT BLOOD PRESSURES  BP Readings from Last 3 Encounters:   10/09/23 114/68   07/02/23 110/74   05/28/23 (!) 140/84         MOST RECENT LAB DATA  Lab Results  Component Value Date/Time    K 4.0 12/18/2023 03:23 PM    ALT 12 12/18/2023 03:23 PM    CHOL 154 07/01/2023 04:06 PM    HGB 13.9 06/24/2021 10:47 PM    HCT 43.4 06/24/2021 10:47 PM    HBA1CPOC 6.7 07/31/2022 09:24 AM

## 2024-04-29 ENCOUNTER — Encounter
Payer: PRIVATE HEALTH INSURANCE | Attending: Student in an Organized Health Care Education/Training Program | Primary: Student in an Organized Health Care Education/Training Program

## 2024-06-17 ENCOUNTER — Encounter

## 2024-06-20 NOTE — Telephone Encounter (Signed)
 Medications Requested:  Requested Prescriptions     Pending Prescriptions Disp Refills    allopurinol  (ZYLOPRIM ) 300 MG tablet [Pharmacy Med Name: Allopurinol  300 MG Oral Tablet] 90 tablet 0     Sig: Take 1 tablet by mouth once daily       Preferred Pharmacy:   [facility] [address], [place] - [address] - [place] [phone] GLENWOOD FALCON [phone]  [address]  [place] [address]  Phone: [phone] Fax: [phone]     Name from pharmacy: Allopurinol  300 MG Oral Tablet         Will file in chart as: allopurinol  (ZYLOPRIM ) 300 MG tablet    Sig: Take 1 tablet by mouth once daily    Disp: 90 tablet    Refills: 0    Start: 06/17/2024    Class: Normal    Non-formulary For: Chronic gout without tophus, unspecified cause, unspecified site    Last ordered: 1 year ago (06/15/2023) by Royden LITTIE Hey, MD    Last refill: 04/03/2024    Rx #: [id number]    Gout Medication Protocol Failed07/18/2025 09:42 PM   Protocol Details Last uric acid level resulted within the past 12 months    Last creatinine level resulted within the past 12 months    Visit with authorizing provider in past 9 months or upcoming 90 days    Last ALT level resulted within the past 12 months      To be filled at: [facility] [address] [place] - [address] - [place] [phone] GLENWOOD FALCON [phone]             Date of Last Refill: 06/15/2023    Prescription Refill Protocol reviewed: Yes  Uric acid needed, rx held for PCP to review and authorize please    Signed by Damien CHRISTELLA Dross, MA   Allergy List Reviewed and Verified: Yes    Possible medication to medication interactions reviewed: Yes    Last appt @ PCP Office: 12/18/2023     No future appointments.    MOST RECENT BLOOD PRESSURES  BP Readings from Last 3 Encounters:   10/09/23 114/68   07/02/23 110/74   05/28/23 (!) 140/84         MOST RECENT LAB DATA  Lab Results   Component Value Date/Time    K 4.0 12/18/2023 03:23 PM    ALT 12 12/18/2023 03:23 PM    CHOL 154 07/01/2023 04:06 PM    HGB 13.9 06/24/2021 10:47  PM    HCT 43.4 06/24/2021 10:47 PM    HBA1CPOC 6.7 07/31/2022 09:24 AM

## 2024-06-20 NOTE — Telephone Encounter (Signed)
 Gout Medication Protocol Failed07/18/2025 09:42 PM   Protocol Details Last uric acid level resulted within the past 12 months    Last creatinine level resulted within the past 12 months    Visit with authorizing provider in past 9 months or upcoming 90 days    Last ALT level resulted within the past 12 months       To be filled at: [facility] [address] [place] - [address] - [place] [phone] GLENWOOD FALCON [phone]                Date of Last Refill: 06/15/2023     Prescription Refill Protocol reviewed: Yes  Uric acid needed, rx held for PCP to review and authorize please     Signed by Damien CHRISTELLA Dross, MA

## 2024-06-21 MED ORDER — ALLOPURINOL 300 MG PO TABS
300 | ORAL_TABLET | Freq: Every day | ORAL | 0 refills | 90.00000 days | Status: DC
Start: 2024-06-21 — End: 2024-07-28

## 2024-07-04 MED ORDER — LANTUS SOLOSTAR 100 UNIT/ML SC SOPN
100 | Freq: Every evening | SUBCUTANEOUS | 0 refills | 50.00000 days | Status: AC
Start: 2024-07-04 — End: ?

## 2024-07-04 NOTE — Telephone Encounter (Signed)
 Patient reports taking 20 units. Ok to send?    Insulin Protocol Failed08/01/2024 06:09 PM   Protocol Details Last HgbA1c resulted within the past 6 months    Visit with authorizing provider in past 6 months or upcoming 90 days    Last creatinine level resulted within the past 12 months        Medications Requested:  Requested Prescriptions     Pending Prescriptions Disp Refills    LANTUS  SOLOSTAR 100 UNIT/ML injection pen [Pharmacy Med Name: Lantus  SoloStar 100 UNIT/ML Subcutaneous Solution Pen-injector] 30 mL 0     Sig: Inject 20 Units into the skin nightly       Preferred Pharmacy:   [facility] [address] [place] - [address] - [place] [phone] GLENWOOD FALCON [phone]  [address]  [place] [address]  Phone: [phone] Fax: [phone]      Date of Last Refill: 06/29/2023    Prescription Refill Protocol reviewed:YES    Allergy List Reviewed and Verified: YES    Possible medication to medication interactions reviewed: YES    Last appt @ PCP Office: 12/18/2023     No future appointments.    MOST RECENT BLOOD PRESSURES  BP Readings from Last 3 Encounters:   10/09/23 114/68   07/02/23 110/74   05/28/23 (!) 140/84         MOST RECENT LAB DATA  Lab Results   Component Value Date/Time    K 4.0 12/18/2023 03:23 PM    ALT 12 12/18/2023 03:23 PM    CHOL 154 07/01/2023 04:06 PM    HGB 13.9 06/24/2021 10:47 PM    HCT 43.4 06/24/2021 10:47 PM    HBA1CPOC 6.7 07/31/2022 09:24 AM

## 2024-07-07 NOTE — Telephone Encounter (Signed)
 PSR contacted Tigran to schedule appointment per [employer]. Patient has been scheduled for 07/28/24 at 4:30 p.m.    Christoher can be reached at [phone]

## 2024-07-20 ENCOUNTER — Encounter

## 2024-07-20 MED ORDER — HYDROCHLOROTHIAZIDE 25 MG PO TABS
25 | ORAL_TABLET | Freq: Every morning | ORAL | 0 refills | 90.00000 days | Status: DC
Start: 2024-07-20 — End: 2024-07-28

## 2024-07-20 NOTE — Telephone Encounter (Addendum)
 Hmg CoA Reductase Inhibitors Protocol Failed08/20/2025 05:02 AM   Protocol Details Last Lipid panel resulted within the past 12 months    Visit with authorizing provider in past 9 months or upcoming 90 days    Last AST and ALT levels < or = to 3x normal within the past 3 years       Diuretics Protocol Passed08/20/2025 05:02 AM   Protocol Details Last creatinine level resulted within the past 12 months    Last potassium level normal, within the past 12 months    Last sodium level normal, within the past 12 months    Visit with authorizing provider in past 9 months or upcoming 90 days        Medications Requested:  Requested Prescriptions     Pending Prescriptions Disp Refills    hydroCHLOROthiazide  (HYDRODIURIL ) 25 MG tablet [Pharmacy Med Name: hydroCHLOROthiazide  25 MG Oral Tablet] 90 tablet 0     Sig: TAKE 1 TABLET BY MOUTH ONCE DAILY IN THE MORNING    atorvastatin  (LIPITOR) 40 MG tablet [Pharmacy Med Name: Atorvastatin  Calcium  40 MG Oral Tablet] 90 tablet 0     Sig: Take 1 tablet by mouth nightly       Preferred Pharmacy:   [facility] [address] [place] - [address] - [place] [phone] GLENWOOD FALCON [phone]  [address]  [place] [address]  Phone: [phone] Fax: [phone]      Date of Last Refill: 07/20/2023 HCTZ, Atorvastatin  04/22/2024    Prescription Refill Protocol reviewed:YES    Allergy List Reviewed and Verified: YES    Possible medication to medication interactions reviewed: YES    Last appt @ PCP Office: 12/18/2023     Future Appointments   Date Time Provider Department Center   07/28/2024  4:30 PM Darreld Royden CROME, MD [facility]   10/21/2024  8:00 AM Darreld Royden CROME, MD [facility]       MOST RECENT BLOOD PRESSURES  BP Readings from Last 3 Encounters:   10/09/23 114/68   07/02/23 110/74   05/28/23 (!) 140/84         MOST RECENT LAB DATA  Lab Results   Component Value Date/Time    K 4.0 12/18/2023 03:23 PM    ALT 12 12/18/2023 03:23 PM    CHOL 154 07/01/2023 04:06 PM    HGB 13.9 06/24/2021  10:47 PM    HCT 43.4 06/24/2021 10:47 PM    HBA1CPOC 6.7 07/31/2022 09:24 AM

## 2024-07-21 MED ORDER — ATORVASTATIN CALCIUM 40 MG PO TABS
40 | ORAL_TABLET | Freq: Every evening | ORAL | 0 refills | 90.00000 days | Status: DC
Start: 2024-07-21 — End: 2024-07-28

## 2024-07-28 ENCOUNTER — Ambulatory Visit
Admit: 2024-07-28 | Discharge: 2024-07-28 | Payer: PRIVATE HEALTH INSURANCE | Attending: Student in an Organized Health Care Education/Training Program | Primary: Student in an Organized Health Care Education/Training Program

## 2024-07-28 ENCOUNTER — Encounter

## 2024-07-28 VITALS — BP 106/68 | HR 110 | Wt 290.2 lb

## 2024-07-28 DIAGNOSIS — E119 Type 2 diabetes mellitus without complications: Principal | ICD-10-CM

## 2024-07-28 LAB — AMB POC HEMOGLOBIN A1C: Hemoglobin A1C, POC: 5.4 %

## 2024-07-28 MED ORDER — AMLODIPINE BESYLATE 10 MG PO TABS
10 | ORAL_TABLET | Freq: Every day | ORAL | 3 refills | 90.00000 days | Status: AC
Start: 2024-07-28 — End: ?

## 2024-07-28 MED ORDER — HYDROCHLOROTHIAZIDE 25 MG PO TABS
25 | ORAL_TABLET | Freq: Every morning | ORAL | 3 refills | 90.00000 days | Status: AC
Start: 2024-07-28 — End: ?

## 2024-07-28 MED ORDER — EMPAGLIFLOZIN 25 MG PO TABS
25 | ORAL_TABLET | Freq: Every day | ORAL | 3 refills | 90.00000 days | Status: AC
Start: 2024-07-28 — End: ?

## 2024-07-28 MED ORDER — RYBELSUS 14 MG PO TABS
14 | ORAL_TABLET | ORAL | 3 refills | 84.00000 days | Status: AC
Start: 2024-07-28 — End: ?

## 2024-07-28 MED ORDER — ATORVASTATIN CALCIUM 40 MG PO TABS
40 | ORAL_TABLET | Freq: Every evening | ORAL | 3 refills | 90.00000 days | Status: AC
Start: 2024-07-28 — End: ?

## 2024-07-28 MED ORDER — ALLOPURINOL 300 MG PO TABS
300 | ORAL_TABLET | Freq: Every day | ORAL | 3 refills | 90.00000 days | Status: AC
Start: 2024-07-28 — End: ?

## 2024-07-28 MED ORDER — LISINOPRIL 40 MG PO TABS
40 | ORAL_TABLET | Freq: Every day | ORAL | 3 refills | 90.00000 days | Status: AC
Start: 2024-07-28 — End: ?

## 2024-07-28 NOTE — Telephone Encounter (Signed)
 Medications Requested:  Requested Prescriptions     Pending Prescriptions Disp Refills    allopurinol  (ZYLOPRIM ) 300 MG tablet 90 tablet 3     Sig: Take 1 tablet by mouth daily    amLODIPine  (NORVASC ) 10 MG tablet 90 tablet 3     Sig: Take 1 tablet by mouth daily    atorvastatin  (LIPITOR) 40 MG tablet 90 tablet 3     Sig: Take 1 tablet by mouth nightly    empagliflozin  (JARDIANCE ) 25 MG tablet 90 tablet 3     Sig: Take 1 tablet by mouth daily    hydroCHLOROthiazide  (HYDRODIURIL ) 25 MG tablet 90 tablet 3     Sig: Take 1 tablet by mouth every morning    lisinopril  (PRINIVIL ;ZESTRIL ) 40 MG tablet 90 tablet 3     Sig: Take 1 tablet by mouth daily    Semaglutide  (RYBELSUS ) 14 MG TABS 90 tablet 3     Sig: Take 1 tab  by mouth daily.       Preferred Pharmacy:   [facility] [address], [place] - [address] - [place] [phone] GLENWOOD FALCON [phone]  [address]  [place] [address]  Phone: [phone] Fax: [phone]        Prescription Refill Protocol reviewed:YES/NO: Yes  Allergy List Reviewed and Verified:YES/NO: Yes  Future Appointments   Date Time Provider Department Center   07/28/2024  4:30 PM Darreld Royden CROME, MD [facility]   10/21/2024  8:00 AM Darreld Royden CROME, MD [facility]       MOST RECENT BLOOD PRESSURES  BP Readings from Last 3 Encounters:   10/09/23 114/68   07/02/23 110/74   05/28/23 (!) 140/84         MOST RECENT LAB DATA  Lab Results   Component Value Date/Time    K 4.0 12/18/2023 03:23 PM    ALT 12 12/18/2023 03:23 PM    CHOL 154 07/01/2023 04:06 PM    HGB 13.9 06/24/2021 10:47 PM    HCT 43.4 06/24/2021 10:47 PM    HBA1CPOC 6.7 07/31/2022 09:24 AM

## 2024-07-28 NOTE — Progress Notes (Signed)
 [facility] [address]   [address] [place] [phone]    CHIEF COMPLAINT     Peter Mueller is a 43 y.o. male who presents to clinic for Follow-up Chronic Condition    HISTORY OF PRESENT ILLNESS     DM2:  A1C 5.4% today.  Has been working on improving diet.     Belching:  Reports severe belching, with malodorous smell.  Not improved with use of ginger or simethicone.  Also reports loose stools.     REVIEW OF SYSTEMS     As per HPI.    PAST MEDICAL & SURGICAL HISTORY     Past Medical History:   Diagnosis Date    CKD (chronic kidney disease), stage III (HCC)     SPEP and UPEP negative for BJP 03/2022. Normal US  03/2022. C/S nephrology (Dr. Alberteen Blake) 07/2022.    DM2 (diabetes mellitus, type 2) (HCC)     Gout     On allopurinol .    HLD (hyperlipidemia)     HTN (hypertension)     Morbid obesity (HCC)      History reviewed. No pertinent surgical history.    MEDICATIONS     Current Outpatient Medications   Medication Sig    allopurinol  (ZYLOPRIM ) 300 MG tablet Take 1 tablet by mouth daily    amLODIPine  (NORVASC ) 10 MG tablet Take 1 tablet by mouth daily    atorvastatin  (LIPITOR) 40 MG tablet Take 1 tablet by mouth nightly    empagliflozin  (JARDIANCE ) 25 MG tablet Take 1 tablet by mouth daily    hydroCHLOROthiazide  (HYDRODIURIL ) 25 MG tablet Take 1 tablet by mouth every morning    lisinopril  (PRINIVIL ;ZESTRIL ) 40 MG tablet Take 1 tablet by mouth daily    Semaglutide  (RYBELSUS ) 14 MG TABS Take 1 tab  by mouth daily.    LANTUS  SOLOSTAR 100 UNIT/ML injection pen Inject 20 Units into the skin nightly    senna (SENOKOT) 8.6 MG tablet Take 1 tablet by mouth 2 times daily as needed for Constipation    glucose monitoring kit 1 kit by Does not apply route daily Please dispense 1 month supply of test strips, test lancet, and all necessary supplies.    clotrimazole -betamethasone  (LOTRISONE ) 1-0.05 % cream Apply topically 2 times daily.     No current facility-administered medications for this visit.      ALLERGIES     Allergies   Allergen Reactions    Nifedipine      Other reaction(s): Unknown (comments)  Allergy listed in HIN- no reaction noted.      FAMILY & SOCIAL HISTORY     Family History   Problem Relation Age of Onset    Hypertension Mother     Hypertension Father     Diabetes Father     Stroke Paternal Uncle         Age 38s     Social History     Social History Narrative    Lives in [place], [place].  [place] from [place].  Originally from [place], [place].  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  Single, no children.  Current smoker of 5-7 cigarettes per day (started age 36).  Occasional alcohol.  Daily cannabis use.  No hard drugs.        PHYSICAL EXAM     BP 106/68   Pulse (!) 110   Wt 131.6 kg (290 lb 3.2 oz)   SpO2 98%   BMI 37.26 kg/m  GENERAL:  Appears well. No acute distress.   RESPIRATORY:  Non-labored breathing.   CARDIOVASCULAR:  Well perfused.Peter Mueller  PSYCHIATRIC:  Appropriate affect.     ASSESSMENT & PLAN     1. Type 2 diabetes mellitus without complication, with long-term current use of insulin (HCC)  Very well-controlled on current regimen.  Instructed to reduce Lantus  to 12 units QD.  RX'd a CGM to help monitor sugars.  F/U in 3 MO.   - Lipid Panel; Future  - Albumin/Creatinine Ratio, Urine; Future  - AMB POC HEMOGLOBIN A1C  - Continuous Glucose Sensor (FREESTYLE LIBRE 3 SENSOR) MISC; Check glucose 3 times daily in management of DM2 (E11.9)  Dispense: 1 each; Refill: 3  - Continuous Glucose Receiver (FREESTYLE LIBRE 3 READER) DEVI; Check glucose 3 times daily in management of DM2 (E11.9)  Dispense: 1 each; Refill: 0    2. Belching  W/U including H pylori and TrioSmart breath testing ordered.  F/U in 3 MO.   - H. Pylori, Stool; Future      Royden LITTIE Hey, MD  07/28/2024

## 2024-07-29 MED ORDER — FREESTYLE LIBRE 3 SENSOR MISC
3 refills | Status: DC
Start: 2024-07-29 — End: 2024-09-16

## 2024-07-29 MED ORDER — FREESTYLE LIBRE 3 READER DEVI
0 refills | Status: DC
Start: 2024-07-29 — End: 2024-08-29

## 2024-07-29 NOTE — Telephone Encounter (Signed)
Pt. Notified via mychart.

## 2024-07-29 NOTE — Telephone Encounter (Signed)
-----   Message from Dr. Royden Hey, MD sent at 07/28/2024  8:48 PM EDT -----  Please let patient know I sent in a RX for a continuous glucose monitor for him.  I thought he might like to use this for tracking his sugars and receiving instant feedback on diet impact.

## 2024-08-18 ENCOUNTER — Encounter

## 2024-08-18 ENCOUNTER — Inpatient Hospital Stay
Admit: 2024-08-18 | Payer: PRIVATE HEALTH INSURANCE | Primary: Student in an Organized Health Care Education/Training Program

## 2024-08-18 DIAGNOSIS — N1832 Chronic kidney disease, stage 3b (HCC): Principal | ICD-10-CM

## 2024-08-18 LAB — COMPREHENSIVE METABOLIC PANEL
ALT: 14 U/L (ref 3–35)
AST: 14 U/L — ABNORMAL LOW (ref 15–40)
Albumin/Globulin Ratio: 1.3
Albumin: 4 g/dL (ref 3.5–5.7)
Alk Phosphatase: 67 U/L (ref 34–104)
Anion Gap: 6 mmol/L (ref 3–16)
BUN/Creatinine Ratio: 15
BUN: 35 mg/dL — ABNORMAL HIGH (ref 7–25)
CO2: 27 mmol/L (ref 20–32)
Calcium: 9.9 mg/dL (ref 8.6–10.3)
Chloride: 107 mmol/L (ref 98–107)
Creatinine: 2.28 mg/dL — ABNORMAL HIGH (ref 0.7–1.3)
Est, Glom Filt Rate: 36 ml/min/1.73m2
Globulin: 3.1 g/dL
Glucose: 90 mg/dL (ref 70–99)
Potassium: 4 mmol/L (ref 3.4–4.5)
Sodium: 140 mmol/L (ref 136–145)
Total Bilirubin: 0.7 mg/dL (ref 0.1–1.2)
Total Protein: 7.1 g/dL (ref 6.0–8.3)

## 2024-08-18 LAB — ALBUMIN/CREATININE RATIO, URINE
Albumin Urine: 59 mg/L — ABNORMAL HIGH (ref 0.00–30.00)
Albumin/Creatinine Ratio: 49 mg/g — ABNORMAL HIGH (ref 0–30)
Creatinine, Ur: 119.87 mg/dL

## 2024-08-18 LAB — LIPID PANEL
Cholesterol, Total: 144 mg/dL — ABNORMAL LOW (ref 155–199)
HDL: 27 mg/dL — ABNORMAL LOW (ref 40–70)
LDL Cholesterol: 92.8 mg/dL (ref 70–129)
Non-HDL Cholesterol: 117 mg/dL (ref <150)
Triglycerides: 121 mg/dL (ref 50–149)

## 2024-08-19 ENCOUNTER — Inpatient Hospital Stay
Admit: 2024-08-19 | Payer: PRIVATE HEALTH INSURANCE | Primary: Student in an Organized Health Care Education/Training Program

## 2024-08-19 DIAGNOSIS — R142 Eructation: Secondary | ICD-10-CM

## 2024-08-24 LAB — H. PYLORI WITH CLARITHROMYCIN RESISTANCE: H Pylori Ag, Stool: NOT DETECTED

## 2024-08-24 NOTE — Telephone Encounter (Signed)
-----   Message from Dr. Royden Hey, MD sent at 08/23/2024  5:18 PM EDT -----  Please notify patient that his renal function markers have improved quite substantially and are now back to levels in 2023.  His protein spillage is also substantially down, which is a great sign   that kidney disease is reversing.  Keep up the good work!  ----- Message -----  From: Edi, Cov Incoming 2.5.1 Lab Results  Sent: 08/18/2024   7:06 PM EDT  To: Royden LITTIE Hey, MD

## 2024-08-27 ENCOUNTER — Encounter

## 2024-08-29 MED ORDER — FREESTYLE LIBRE 3 READER DEVI
0 refills | Status: AC
Start: 2024-08-29 — End: ?

## 2024-08-29 NOTE — Telephone Encounter (Signed)
 My chart message sent

## 2024-08-29 NOTE — Telephone Encounter (Signed)
-----   Message from Dr. Royden Hey, MD sent at 08/28/2024 11:14 AM EDT -----  H pylori negative.   ----- Message -----  From: Edwina Brandy Incoming 2.5.1 Lab Results  Sent: 08/24/2024   5:43 PM EDT  To: Royden LITTIE Hey, MD

## 2024-08-29 NOTE — Telephone Encounter (Signed)
 Medications Requested:  Requested Prescriptions     Pending Prescriptions Disp Refills    Continuous Glucose Receiver (FREESTYLE LIBRE 3 READER) DEVI [Pharmacy Med Name: FREESTYLE LIBRE 3 READER MIS] 1 each 0     Sig: USE TO CHECK BLOOD GLUCOSE 3 TIMES DAILY       Preferred Pharmacy:   [facility] [address] [place] - [address] - [place] [phone] [facility]WO[facility] FALCON [phone]  [address]  [place] [address]  Phone: [phone] Fax: [phone]      Date of Last Refill: resent, states PA needed and will be prompted once order is signed.    Prescription Refill Protocol reviewed:YES    Allergy List Reviewed and Verified: YES    Possible medication to medication interactions reviewed: YES    Last appt @ PCP Office: 07/28/2024     Future Appointments   Date Time Provider Department Center   10/21/2024  8:00 AM Darreld Royden CROME, MD [facility]       MOST RECENT BLOOD PRESSURES  BP Readings from Last 3 Encounters:   07/28/24 106/68   10/09/23 114/68   07/02/23 110/74         MOST RECENT LAB DATA  Lab Results   Component Value Date/Time    K 4.0 08/18/2024 02:12 PM    ALT 14 08/18/2024 02:12 PM    CHOL 144 08/18/2024 02:12 PM    HGB 13.9 06/24/2021 10:47 PM    HCT 43.4 06/24/2021 10:47 PM    HBA1CPOC 5.4 07/28/2024 04:52 PM

## 2024-09-16 ENCOUNTER — Encounter

## 2024-09-16 MED ORDER — FREESTYLE LIBRE 3 SENSOR MISC
3 refills | Status: DC
Start: 2024-09-16 — End: 2024-11-03

## 2024-09-16 NOTE — Telephone Encounter (Signed)
 SABRA

## 2024-10-21 ENCOUNTER — Encounter
Payer: PRIVATE HEALTH INSURANCE | Attending: Student in an Organized Health Care Education/Training Program | Primary: Student in an Organized Health Care Education/Training Program

## 2024-11-03 ENCOUNTER — Inpatient Hospital Stay
Admit: 2024-11-03 | Payer: PRIVATE HEALTH INSURANCE | Primary: Student in an Organized Health Care Education/Training Program

## 2024-11-03 ENCOUNTER — Ambulatory Visit
Admit: 2024-11-03 | Discharge: 2024-11-03 | Payer: PRIVATE HEALTH INSURANCE | Attending: Student in an Organized Health Care Education/Training Program | Primary: Student in an Organized Health Care Education/Training Program

## 2024-11-03 VITALS — BP 118/76 | HR 87 | Ht 74.02 in | Wt 284.0 lb

## 2024-11-03 DIAGNOSIS — Z794 Long term (current) use of insulin: Principal | ICD-10-CM

## 2024-11-03 DIAGNOSIS — E1142 Type 2 diabetes mellitus with diabetic polyneuropathy: Secondary | ICD-10-CM

## 2024-11-03 DIAGNOSIS — Z0000 Encounter for general adult medical examination without abnormal findings: Secondary | ICD-10-CM

## 2024-11-03 LAB — THYROID CASCADE PROFILE: TSH, 3rd Generation: 1.707 u[IU]/mL (ref 0.400–4.000)

## 2024-11-03 LAB — URIC ACID: Uric Acid: 6.1 mg/dL (ref 3.8–7.6)

## 2024-11-03 LAB — HEMOGLOBIN A1C
Estimated Avg Glucose: 117 mg/dL
Hemoglobin A1C: 5.7 %

## 2024-11-03 LAB — VITAMIN B12: Vitamin B-12: 502 pg/mL (ref 200–900)

## 2024-11-03 MED ORDER — FREESTYLE LIBRE 3 PLUS SENSOR MISC
3 refills | Status: AC
Start: 2024-11-03 — End: ?

## 2024-11-03 MED ORDER — TERBINAFINE HCL 250 MG PO TABS
250 | ORAL_TABLET | Freq: Every day | ORAL | 0 refills | 24.00000 days | Status: AC
Start: 2024-11-03 — End: 2025-01-26

## 2024-11-03 NOTE — Progress Notes (Signed)
"  Last PE= 10/09/23    PREVENTIVE CARE - SCREENINGS  Colon cancer:  Not indicated Patient age 43   Lung cancer: Not indicated Patient age 11   Smoking Hx  reports that he has been smoking cigarettes. He has never used smokeless tobacco.   Prostate Cancer: Not indicated Patient age 89   PSA: No results found for: PSA, PSASERMON, XBPSA, PSAFREETOTAL, PSAFRTOT, PSAEXT, PSAPOC   AAA: Not indicated Patient age 56   Hepatitis C: Up to date   Lab Results   Component Value Date    HEPCAB Negative 04/30/2022      Lipids:  Lab Results   Component Value Date    CHOL 144 (L) 08/18/2024    TRIG 121 08/18/2024    HDL 27 (L) 08/18/2024    LDL 92.8 08/18/2024        Influenza: Up to date Given 09/01/24, found in immpact, chart updated   Pneumonia: Up to date 20 given 07/31/22   Shingrix: Not indicated Patient age 65   Td/Tdap: Up to date Tdap given 07/31/22   COVID 19: Due 2 doses given   RSV: Not indicated Patient age 84     Diabetes: Due A1C, Foot exam  Patient dx DMT2  Lab Results   Component Value Date    NA 140 08/18/2024    K 4.0 08/18/2024    CL 107 08/18/2024    CO2 27 08/18/2024    BUN 35 (H) 08/18/2024    CREATININE 2.28 (H) 08/18/2024    GLUCOSE 90 08/18/2024    CALCIUM  9.9 08/18/2024    LABALBU 116.1 04/30/2022    BILITOT 0.70 08/18/2024    ALKPHOS 67 08/18/2024    AST 14 (L) 08/18/2024    ALT 14 08/18/2024    LABGLOM 36 08/18/2024    GFRAA 47 (L) 06/24/2021    AGRATIO 2.70 04/30/2022    GLOB 3.1 08/18/2024      Fasting BS 08/18/24 1412-90  12/18/23 1523-116   A1C Hemoglobin A1C   Date Value Ref Range Status   10/08/2023 6.2 % Final     Hemoglobin A1C, POC   Date Value Ref Range Status   07/28/2024 5.4 % Final      DM eye exam Date of last diabetic eye exam: 07/22/2023   DM foot exam Diabetic Foot Exam HM Status   Topic Date Due    Diabetic Foot Exam  05/27/2024      Microalb 08/18/24     Active orders for HM gaps: None  HIN reviewed: Yes      "

## 2024-11-03 NOTE — Progress Notes (Signed)
 "[facility] [address]   [address] [place] [id number]    CHIEF COMPLAINT     Peter Mueller is a 43 y.o. male who presents to clinic for Annual Exam    HISTORY OF PRESENT ILLNESS & REVIEW OF SYSTEMS     Concerns:  None.     WELLNESS EVALUATION & HEALTH RISK ASSESSMENT      General health:  Feels well overall.   Diet:  Has made significant progress in WL - down 86 LBS over the past 2 YRS.  Exercise:  Doing some light exercise.   Blood pressure:  118/76 today.     Oral health:  Not currently following with a dentist.     Substance use:    Tobacco - Current smoker  Alcohol - Occasional  Illicit drug use in the past year - No  Taking a prescription opiate - No    Depression screening:  PHQ-2 Over the past 2 weeks, how often have you been bothered by any of the following problems?  Little interest or pleasure in doing things: Not at all  Feeling down, depressed, or hopeless: Not at all  PHQ-2 Score: 0    PHQ-9: Over the past 2 weeks, how often have you been bothered by any of the following problems?  Little interest or pleasure in doing things: Not at all  Feeling down, depressed, or hopeless: Not at all  PHQ-9 Total Score: 0    Skin concerns:  None.     PREVENTIVE CARE SCREENING & IMMUNIZATIONS     Diabetes:   Lab Results   Component Value Date    LABA1C 5.7 11/03/2024     Dyslipidemia:   Lab Results   Component Value Date    CHOL 144 (L) 08/18/2024    TRIG 121 08/18/2024    HDL 27 (L) 08/18/2024     Chronic kidney disease:   Lab Results   Component Value Date    GFRAA 47 (L) 06/24/2021    LABGLOM 36 08/18/2024    BUN 35 (H) 08/18/2024    NA 140 08/18/2024    K 4.0 08/18/2024    CL 107 08/18/2024    CO2 27 08/18/2024     Hepatitis C:  Negative 04/30/2022.   Abdominal aortic aneurysm (US ):  Due age 48.   Colorectal cancer (FIT/Colonoscopy):  Due age 53.   Lung cancer (LDCT):  Due age 45.  Prostate cancer (PSA):  Due age 15.     Immunizations:  Immunization History   Administered Date(s)  Administered    COVID-19, Inactive, MODERNA BLUE border, Primary or Immunocompromised, (age 12y+) 08/04/2020, 09/01/2020    Influenza Virus Vaccine 10/01/2020, 10/01/2021, 09/14/2023, 09/01/2024    Influenza, FLUARIX, FLULAVAL, FLUZONE (age 29 mo+) and AFLURIA, (age 32 y+), Quadv PF, 0.90mL 09/24/2022    Pneumococcal, PCV20, PREVNAR 20, (age 6w+), IM, 0.38mL 07/31/2022    TDaP, ADACEL (age 23y-64y), MYRTICE (age 10y+), IM, 0.8mL 07/31/2022     PAST MEDICAL & SURGICAL HISTORY     Past Medical History:   Diagnosis Date    CKD (chronic kidney disease), stage III (HCC)     SPEP and UPEP negative for BJP 03/2022. Normal US  03/2022. C/S nephrology (Dr. Alberteen Blake) 07/2022.    Diabetic peripheral neuropathy (HCC)     DM2 (diabetes mellitus, type 2) (HCC)     Gout     On allopurinol .    HLD (hyperlipidemia)     HTN (hypertension)  Morbid obesity (HCC)      History reviewed. No pertinent surgical history.    MEDICATIONS     Current Outpatient Medications   Medication Sig    Continuous Glucose Sensor (FREESTYLE LIBRE 3 PLUS SENSOR) MISC Monitor glucose for management of diabetes type 2 (E11.9)    terbinafine  (LAMISIL ) 250 MG tablet Take 1 tablet by mouth daily    Continuous Glucose Receiver (FREESTYLE LIBRE 3 READER) DEVI USE TO CHECK BLOOD GLUCOSE 3 TIMES DAILY    allopurinol  (ZYLOPRIM ) 300 MG tablet Take 1 tablet by mouth daily    amLODIPine  (NORVASC ) 10 MG tablet Take 1 tablet by mouth daily    atorvastatin  (LIPITOR) 40 MG tablet Take 1 tablet by mouth nightly    empagliflozin  (JARDIANCE ) 25 MG tablet Take 1 tablet by mouth daily    hydroCHLOROthiazide  (HYDRODIURIL ) 25 MG tablet Take 1 tablet by mouth every morning    lisinopril  (PRINIVIL ;ZESTRIL ) 40 MG tablet Take 1 tablet by mouth daily    Semaglutide  (RYBELSUS ) 14 MG TABS Take 1 tab  by mouth daily.    LANTUS  SOLOSTAR 100 UNIT/ML injection pen Inject 20 Units into the skin nightly    glucose monitoring kit 1 kit by Does not apply route daily Please dispense 1 month supply of  test strips, test lancet, and all necessary supplies.    clotrimazole -betamethasone  (LOTRISONE ) 1-0.05 % cream Apply topically 2 times daily.     No current facility-administered medications for this visit.     ALLERGIES     Allergies   Allergen Reactions    Nifedipine      Other reaction(s): Unknown (comments)  Allergy listed in HIN- no reaction noted.      FAMILY & SOCIAL HISTORY     Family History   Problem Relation Age of Onset    Hypertension Mother     Hypertension Father     Diabetes Father     Stroke Paternal Uncle         Age 51s     Social History     Social History Narrative    Lives in [place], [place] from [place].  Originally from [place], [place].  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  Single, no children.  Current smoker of 5-7 cigarettes per day (started age 19).  Occasional alcohol.  Daily cannabis use.  No hard drugs.        PHYSICAL EXAM     BP 118/76   Pulse 87   Ht 1.88 m (6' 2.02)   Wt 128.8 kg (284 lb)   SpO2 99%   BMI 36.45 kg/m     GENERAL:  Well-nourished. Well-groomed. No acute distress.   EYES:  PERRL. EOMI. Conjunctiva and sclera clear.  EARS/NOSE/THROAT:  External auditory canals clear with grossly normal hearing. TMs clear BL. Oropharynx without lesions. MMM. Good dentition.  NECK:  Supple. No masses, thyromegaly, or adenopathy.  RESPIRATORY:  Non-labored breathing. Lungs CTA without wheezes, rales, or rhonchi.  CARDIOVASCULAR:  Well-perfused. RRR. Normal S1/S2. No murmurs. BL LEs without edema.  ABDOMEN:  Soft. Non-tender. Non-distended.   FOOT EXAM:  Normal skin without calluses, ulcers, or lesions.  BL toenail thickening and dystrophy.  Absent plantar sensation with monofilament testing in all areas.  Fair to poor perfusion with +1 DP/PT pulses BL. BL pes planus.   PSYCHIATRIC:  Appropriate affect, makes good eye contact, normal concentration and attention.    ASSESSMENT & PLAN     1. Annual physical exam  BMI  in obese range.  Patient thoroughly commended on the lifestyle changes he  has adopted which have led to 86 LBS of WL in 1 YR.  HE is doing an excellent job and plans to continue.  BP within healthy range - if continues to lose WT, will need to lower his BP medication doses.  Reviewed recent lab work - no concerns.  UTD on immunizations. Provided with age-appropriate preventive care recommendations.     Patient received health guidance information on balanced nutrition, regular exercise, eating disorders and normal weight, tobacco and illicit drug avoidance, safe limits of alcohol consumption, safety belt and helmet use, hearing loss prevention, limiting UV/sun exposure and sunscreen use, depression symptoms, suicide prevention, self testicular exams, and personalized screening and health recommendations including age-appropriate vaccinations.    2. Type 2 diabetes mellitus with diabetic polyneuropathy, with long-term current use of insulin (HCC)  3. Diabetic peripheral neuropathy (HCC)  Patient's diabetes is very well-controlled, last checked 07/2024 and 5.4%.  Repeat level ordered.  CGM sensors refilled.  He endorses numbness of his plantar feet, and found to have no sensation on monofilament testing.  Education provided on diabetic peripheral neuropathy (most likely etiology), with daily foot checks recommended.  Will also check a vitamin B1 / B12 and TSH to R/O other causes or contributing factors.  I think he would benefit from a consultation with podiatry for evaluation of this and below issues.   - Continuous Glucose Sensor (FREESTYLE LIBRE 3 PLUS SENSOR) MISC; Monitor glucose for management of diabetes type 2 (E11.9)  Dispense: 6 each; Refill: 3  - Vitamin B12; Future  - Vitamin B1, Whole Blood; Future  - Thyroid Cascade Profile; Future  - Hemoglobin A1C; Future  - External Referral To Podiatry    4. Pes planus of both feet  Noted on PEX.  Patient would benefit from custom orthotics to for arch support and prevention of further deterioration.  Referral ordered to podiatry.   -  External Referral To Podiatry    5. Onychomycosis  Noted on PEX.  RX sent for terbinafine .   Hepatic function normal on recent lab work.   - terbinafine  (LAMISIL ) 250 MG tablet; Take 1 tablet by mouth daily  Dispense: 84 tablet; Refill: 0    6. History of gout  Currently on allopurinol .  Repeat uric acid ordered for monitoring.   - Uric Acid; Future      Royden LITTIE Hey, MD  11/03/2024     PATIENT CARE TEAM  Patient Care Team:  Hey Royden LITTIE, MD as PCP - General  "

## 2024-11-03 NOTE — Patient Instructions (Signed)
 Check out [employer] on [address].    GENERAL HEALTH  The best way to live healthy is to have a healthy lifestyle by eating a well-balanced diet, exercising regularly, limiting alcohol and stopping smoking if you are a smoker.  Try to exercise at least 30 minutes a day, this is better than any prescription medicine to keep you healthy.   Eat at least 5 servings of fruits and vegetables every daily and reduce red meats, processed meat (such as deli cold cuts), white breads and pastas. Limit or eliminate sweets and sugary drinks from your diet.  Try to keep your weight healthy. Your body mass index (BMI) should be between 18 and 25. If it is between 25 and 30 you are overweight and if it is 30 or higher, that is called obesity. Being overweight or obese increases risk of high blood pressure, arthritis, sleep apnea, diabetes and many cancers.  High blood pressure causes damage to your blood vessels, heart, kidneys and other organs if untreated. Your blood pressure should be under 140/90 with an ideal level of 120/80 or less to prevent heart disease, strokes and kidney disease.  Wear your seat belt every time you are in a vehicle.   Use sunscreen (SPF 15-30) or cover your skin when you are outdoors. 1 in 60 people will develop melanoma (skin cancer) which is mostly preventable.  Smoking makes all health problems worse. If you smoke, talk to your provider about stop-smoking programs and medicines.  See a dentist one or two times a year for checkups and to have your teeth cleaned.   Annual eye exams are recommended to screen for glaucoma and cataracts.     IMMUNIZATIONS  Everyone should have a yearly flu shot   Tetanus, Diphtheria & Pertussis (TDaP) vaccine is recommended every 10 years.  The shingles vaccine called Shingrix is recommended once in a lifetime after age 6. This is given as a 2-dose series and you can get it at your local pharmacy. Shingrix is now recommended instead of the older Zostavax as it is 90%  effective compared to 50% for the Zostavax. You can get the Shingrix vaccine even if you had the Zostavax in the past.  All people over age 75 should have a pneumonia vaccine to prevent pneumonia. This is called Pneumovax-23 and is once in a lifetime vaccines except in special cases. Some people may benefit from a different vaccine called Prevnar-13 in addition to Pneumovax-23.    PREVENTIVE SCREENING  Screening for diabetes mellitus with a blood sugar test should be done annually if you have risk factors such as high blood pressure, high cholesterol, are overweight, have a family history of diabetes or you have had high blood sugars in the past, especially during pregnancy (gestational diabetes).  Cholesterol tests check for elevated lipids (fatty part of blood) which can lead to heart disease and strokes are recommended every 5 years after age 66. If you have elevated cholesterol, diabetes or have had a heart attack or stroke you should have testing more frequently.  Colon cancer screening that evaluates for blood or polyps in your colon can prevent colon cancer and should be done yearly as a stool test or every 10 years as a colonoscopy up to age 73. Screening can be done up to age 32 if you are still very healthy but has higher risks after age 7. Colonoscopies may be recommended more frequently if precancerous lesions were found.   Men are eligible for a blood screening  blood test for prostate cancer called PSA. The current recommendation is to consider this between ages 58 and 67 only as men over 54 do not seem to benefit from this screening. Men under 70 can benefit to some degree and whether to have this test is a decision that you should think about and discuss with your provider. If you have a family history of prostate cancer, the recommendation may be different.  Abdominal Aortic Aneurysm (AAA) screening at 65 is recommended if you ever smoked or have a family history of AAA.  Lung Cancer Screening with  an annual low-dose CT scan is recommended if you are between age 23 and 12, smoked at least 30 pack years (the equivalent of 1 pack per day for 30 years), and are a current smoker or quit less than 15 years ago.  Hepatitis C screening is recommended one time for everyone.  HIV and syphilis screening are recommended if you are risk.

## 2024-11-04 NOTE — Telephone Encounter (Signed)
"  Letter sent  "

## 2024-11-08 ENCOUNTER — Encounter

## 2024-11-08 LAB — VITAMIN B1, WHOLE BLOOD
